# Patient Record
Sex: Female | Born: 1980 | Race: Black or African American | Hispanic: No | Marital: Single | Smoking: Light tobacco smoker
Health system: Southern US, Community
[De-identification: ages and names within clinical notes are randomized; demographics above are authoritative.]

## PROBLEM LIST (undated history)

## (undated) DIAGNOSIS — F419 Anxiety disorder, unspecified: Secondary | ICD-10-CM

## (undated) DIAGNOSIS — J189 Pneumonia, unspecified organism: Secondary | ICD-10-CM

## (undated) DIAGNOSIS — M545 Low back pain, unspecified: Secondary | ICD-10-CM

## (undated) DIAGNOSIS — F329 Major depressive disorder, single episode, unspecified: Secondary | ICD-10-CM

## (undated) DIAGNOSIS — L309 Dermatitis, unspecified: Secondary | ICD-10-CM

## (undated) DIAGNOSIS — G8929 Other chronic pain: Secondary | ICD-10-CM

## (undated) DIAGNOSIS — K219 Gastro-esophageal reflux disease without esophagitis: Secondary | ICD-10-CM

## (undated) DIAGNOSIS — R519 Headache, unspecified: Secondary | ICD-10-CM

## (undated) DIAGNOSIS — R51 Headache: Secondary | ICD-10-CM

## (undated) DIAGNOSIS — J45909 Unspecified asthma, uncomplicated: Secondary | ICD-10-CM

## (undated) DIAGNOSIS — R7303 Prediabetes: Secondary | ICD-10-CM

## (undated) DIAGNOSIS — Z9109 Other allergy status, other than to drugs and biological substances: Secondary | ICD-10-CM

## (undated) DIAGNOSIS — F32A Depression, unspecified: Secondary | ICD-10-CM

## (undated) HISTORY — PX: TOOTH EXTRACTION: SUR596

---

## 1999-03-06 ENCOUNTER — Emergency Department (HOSPITAL_COMMUNITY): Admission: EM | Admit: 1999-03-06 | Discharge: 1999-03-06 | Payer: Self-pay | Admitting: Emergency Medicine

## 1999-05-17 ENCOUNTER — Emergency Department (HOSPITAL_COMMUNITY): Admission: EM | Admit: 1999-05-17 | Discharge: 1999-05-17 | Payer: Self-pay | Admitting: Emergency Medicine

## 1999-06-02 ENCOUNTER — Encounter: Admission: RE | Admit: 1999-06-02 | Discharge: 1999-06-02 | Payer: Self-pay | Admitting: Family Medicine

## 2000-10-15 ENCOUNTER — Inpatient Hospital Stay (HOSPITAL_COMMUNITY): Admission: AD | Admit: 2000-10-15 | Discharge: 2000-10-15 | Payer: Self-pay | Admitting: *Deleted

## 2000-10-17 ENCOUNTER — Inpatient Hospital Stay (HOSPITAL_COMMUNITY): Admission: AD | Admit: 2000-10-17 | Discharge: 2000-10-17 | Payer: Self-pay | Admitting: Obstetrics

## 2009-01-29 ENCOUNTER — Emergency Department (HOSPITAL_COMMUNITY): Admission: EM | Admit: 2009-01-29 | Discharge: 2009-01-30 | Payer: Self-pay | Admitting: Emergency Medicine

## 2009-05-24 ENCOUNTER — Emergency Department (HOSPITAL_COMMUNITY): Admission: EM | Admit: 2009-05-24 | Discharge: 2009-05-24 | Payer: Self-pay | Admitting: Family Medicine

## 2011-03-20 LAB — COMPREHENSIVE METABOLIC PANEL
ALT: 27 U/L (ref 0–35)
CO2: 24 mEq/L (ref 19–32)
Calcium: 9.3 mg/dL (ref 8.4–10.5)
Creatinine, Ser: 0.81 mg/dL (ref 0.4–1.2)
GFR calc Af Amer: >60 mL/min (ref >60)
GFR calc non Af Amer: >60 mL/min (ref >60)
Glucose, Bld: 97 mg/dL (ref 70–99)
Sodium: 139 mEq/L (ref 135–145)
Total Protein: 6.9 g/dL (ref 6.0–8.3)

## 2011-03-20 LAB — CBC
Hemoglobin: 13.4 g/dL (ref 12.0–15.0)
MCHC: 33.3 g/dL (ref 30.0–36.0)
MCV: 89.1 fL (ref 78.0–100.0)
RBC: 4.53 MIL/uL (ref 3.87–5.11)
RDW: 15.1 % (ref 11.5–15.5)

## 2011-03-20 LAB — URINALYSIS, ROUTINE W REFLEX MICROSCOPIC
Glucose, UA: NEGATIVE mg/dL
Nitrite: NEGATIVE
Specific Gravity, Urine: 1.024 (ref 1.005–1.030)
pH: 6 (ref 5.0–8.0)

## 2011-03-20 LAB — LIPASE, BLOOD: Lipase: 24 U/L (ref 11–59)

## 2011-03-20 LAB — DIFFERENTIAL
Lymphocytes Relative: 27 % (ref 12–46)
Lymphs Abs: 2 10*3/uL (ref 0.7–4.0)
Monocytes Relative: 10 % (ref 3–12)
Neutrophils Relative %: 55 % (ref 43–77)

## 2011-03-20 LAB — POCT PREGNANCY, URINE: Preg Test, Ur: NEGATIVE

## 2012-12-27 ENCOUNTER — Emergency Department (HOSPITAL_COMMUNITY)
Admission: EM | Admit: 2012-12-27 | Discharge: 2012-12-27 | Disposition: A | Discharge status: Home / Self Care | Payer: Self-pay | Attending: Emergency Medicine | Admitting: Emergency Medicine

## 2012-12-27 ENCOUNTER — Encounter (HOSPITAL_COMMUNITY): Payer: Self-pay | Admitting: *Deleted

## 2012-12-27 DIAGNOSIS — K047 Periapical abscess without sinus: Secondary | ICD-10-CM | POA: Insufficient documentation

## 2012-12-27 MED ORDER — HYDROCODONE-ACETAMINOPHEN 5-325 MG PO TABS
1.0000 | ORAL_TABLET | Freq: Once | ORAL | Status: AC
  Administered 2012-12-27: 1 via ORAL
  Filled 2012-12-27: qty 1

## 2012-12-27 MED ORDER — IBUPROFEN 800 MG PO TABS: 800.0000 mg | ORAL_TABLET | Freq: Three times a day (TID) | ORAL | Status: DC | PRN

## 2012-12-27 MED ORDER — HYDROCODONE-ACETAMINOPHEN 5-325 MG PO TABS: 1.0000 | ORAL_TABLET | Freq: Four times a day (QID) | ORAL | Status: DC | PRN

## 2012-12-27 MED ORDER — IBUPROFEN 800 MG PO TABS
800.0000 mg | ORAL_TABLET | Freq: Once | ORAL | Status: AC
  Administered 2012-12-27: 800 mg via ORAL
  Filled 2012-12-27: qty 1

## 2012-12-27 MED ORDER — PENICILLIN V POTASSIUM 500 MG PO TABS: 500.0000 mg | ORAL_TABLET | Freq: Four times a day (QID) | ORAL | Status: DC

## 2012-12-27 NOTE — ED Notes (Signed)
Pt sts dental pain on the left side since Wednesday; pt reports "knot above tooth that is getting bigger". Pt sts took aleve for pain early this am.

## 2012-12-27 NOTE — ED Notes (Addendum)
Pt in c/o pain and swelling to left side of face, started as toothache on Wednesday, swelling has increased with increased pain into ear today. Denies fever.

## 2012-12-27 NOTE — ED Provider Notes (Signed)
History     CSN: [id number]  Arrival date & time 12/27/12  1633   First MD Initiated Contact with Patient 12/27/12 1708      Chief Complaint  Patient presents with  . Dental Pain    (Consider location/radiation/quality/duration/timing/severity/associated sxs/prior treatment) HPI Patient presents to the emergency department with left-sided first molar dental pain on the upper teeth.  Patient states this started Wednesday has continued since that time.  Patient states that she has some mild swelling around the gumline.  Patient denies nausea, vomiting, diarrhea, difficulty swallowing, difficulty breathing, or fever.  Patient is a rest with warm salt water, without relief.  She has tried no other treatment for her discomfort or condition History reviewed. No pertinent past medical history.  History reviewed. No pertinent past surgical history.  History reviewed. No pertinent family history.  History  Substance Use Topics  . Smoking status: Not on file  . Smokeless tobacco: Not on file  . Alcohol Use: Not on file    OB History    Grav Para Term Preterm Abortions TAB SAB Ect Mult Living                  Review of Systems All other systems negative except as documented in the HPI. All pertinent positives and negatives as reviewed in the HPI.  Allergies  Shellfish allergy  Home Medications   Current Outpatient Rx  Name  Route  Sig  Dispense  Refill  . ADULT MULTIVITAMIN W/MINERALS CH   Oral   Take 1 tablet by mouth daily.           BP 127/88  Pulse 94  Temp 98.1 F (36.7 C) (Oral)  Resp 20  SpO2 100%  Physical Exam  Nursing note and vitals reviewed. Constitutional: She appears well-developed and well-nourished.  HENT:  Head: Normocephalic and atraumatic.  Mouth/Throat: Uvula is midline.    Eyes: Pupils are equal, round, and reactive to light.  Cardiovascular: Normal rate, regular rhythm and normal heart sounds.   Pulmonary/Chest: Effort normal and  breath sounds normal.    ED Course  Procedures (including critical care time)   The patient will be treated for dental abscess. Told to follow up with dentistry.  MDM          Carlyle Dolly, PA-C 12/27/12 1818

## 2012-12-28 NOTE — ED Provider Notes (Signed)
Medical screening examination/treatment/procedure(s) were performed by non-physician practitioner and as supervising physician I was immediately available for consultation/collaboration.   Flint Melter, MD 12/28/12 [phone]

## 2014-12-17 ENCOUNTER — Telehealth: Payer: Self-pay

## 2014-12-17 NOTE — Telephone Encounter (Signed)
error 

## 2015-04-10 ENCOUNTER — Emergency Department (HOSPITAL_COMMUNITY)
Admission: EM | Admit: 2015-04-10 | Discharge: 2015-04-10 | Disposition: A | Discharge status: Home / Self Care | Payer: Medicaid Other | Attending: Emergency Medicine | Admitting: Emergency Medicine

## 2015-04-10 ENCOUNTER — Encounter (HOSPITAL_COMMUNITY): Payer: Self-pay | Admitting: Emergency Medicine

## 2015-04-10 ENCOUNTER — Emergency Department (HOSPITAL_COMMUNITY): Payer: Medicaid Other

## 2015-04-10 DIAGNOSIS — R1013 Epigastric pain: Secondary | ICD-10-CM | POA: Diagnosis present

## 2015-04-10 DIAGNOSIS — Z792 Long term (current) use of antibiotics: Secondary | ICD-10-CM | POA: Insufficient documentation

## 2015-04-10 DIAGNOSIS — K8018 Calculus of gallbladder with other cholecystitis without obstruction: Secondary | ICD-10-CM | POA: Diagnosis not present

## 2015-04-10 DIAGNOSIS — R109 Unspecified abdominal pain: Secondary | ICD-10-CM

## 2015-04-10 DIAGNOSIS — K8010 Calculus of gallbladder with chronic cholecystitis without obstruction: Secondary | ICD-10-CM

## 2015-04-10 LAB — COMPREHENSIVE METABOLIC PANEL
ALK PHOS: 58 U/L (ref 38–126)
ALT: 45 U/L (ref 14–54)
ANION GAP: 6 (ref 5–15)
AST: 69 U/L — ABNORMAL HIGH (ref 15–41)
Albumin: 3.5 g/dL (ref 3.5–5.0)
BUN: 8 mg/dL (ref 6–20)
CALCIUM: 9 mg/dL (ref 8.9–10.3)
CO2: 22 mmol/L (ref 22–32)
Chloride: 108 mmol/L (ref 101–111)
Creatinine, Ser: 0.87 mg/dL (ref 0.44–1.00)
GFR calc non Af Amer: >60 mL/min (ref >60)
Glucose, Bld: 132 mg/dL — ABNORMAL HIGH (ref 70–99)
Potassium: 4.5 mmol/L (ref 3.5–5.1)
SODIUM: 136 mmol/L (ref 135–145)
TOTAL PROTEIN: 7.1 g/dL (ref 6.5–8.1)
Total Bilirubin: 0.9 mg/dL (ref 0.3–1.2)

## 2015-04-10 LAB — CBC WITH DIFFERENTIAL/PLATELET
BASOS PCT: 0 % (ref 0–1)
Basophils Absolute: 0 10*3/uL (ref 0.0–0.1)
EOS ABS: 0.3 10*3/uL (ref 0.0–0.7)
EOS PCT: 3 % (ref 0–5)
HCT: 37.9 % (ref 36.0–46.0)
Hemoglobin: 12.6 g/dL (ref 12.0–15.0)
LYMPHS ABS: 2.5 10*3/uL (ref 0.7–4.0)
Lymphocytes Relative: 26 % (ref 12–46)
MCH: 28.6 pg (ref 26.0–34.0)
MCHC: 33.2 g/dL (ref 30.0–36.0)
MCV: 85.9 fL (ref 78.0–100.0)
MONOS PCT: 6 % (ref 3–12)
Monocytes Absolute: 0.6 10*3/uL (ref 0.1–1.0)
NEUTROS PCT: 65 % (ref 43–77)
Neutro Abs: 6.2 10*3/uL (ref 1.7–7.7)
PLATELETS: 246 10*3/uL (ref 150–400)
RBC: 4.41 MIL/uL (ref 3.87–5.11)
RDW: 13.1 % (ref 11.5–15.5)
WBC: 9.6 10*3/uL (ref 4.0–10.5)

## 2015-04-10 LAB — URINALYSIS, ROUTINE W REFLEX MICROSCOPIC
Bilirubin Urine: NEGATIVE
Glucose, UA: NEGATIVE mg/dL
KETONES UR: NEGATIVE mg/dL
LEUKOCYTES UA: NEGATIVE
NITRITE: NEGATIVE
PH: 6.5 (ref 5.0–8.0)
Protein, ur: NEGATIVE mg/dL
SPECIFIC GRAVITY, URINE: 1.005 (ref 1.005–1.030)
Urobilinogen, UA: 0.2 mg/dL (ref 0.0–1.0)

## 2015-04-10 LAB — URINE MICROSCOPIC-ADD ON

## 2015-04-10 LAB — LIPASE, BLOOD: Lipase: 38 U/L (ref 22–51)

## 2015-04-10 MED ORDER — PANTOPRAZOLE SODIUM 40 MG PO TBEC
40.0000 mg | DELAYED_RELEASE_TABLET | Freq: Once | ORAL | Status: AC
  Administered 2015-04-10: 40 mg via ORAL
  Filled 2015-04-10: qty 1

## 2015-04-10 MED ORDER — SUCRALFATE 1 G PO TABS
1.0000 g | ORAL_TABLET | Freq: Once | ORAL | Status: AC
  Administered 2015-04-10: 1 g via ORAL
  Filled 2015-04-10: qty 1

## 2015-04-10 MED ORDER — GI COCKTAIL ~~LOC~~
30.0000 mL | Freq: Once | ORAL | Status: AC
  Administered 2015-04-10: 30 mL via ORAL
  Filled 2015-04-10: qty 30

## 2015-04-10 MED ORDER — HYDROCODONE-ACETAMINOPHEN 5-325 MG PO TABS: 1.0000 | ORAL_TABLET | ORAL | Status: DC | PRN

## 2015-04-10 NOTE — ED Notes (Signed)
Ultrasound tech at at bedside.

## 2015-04-10 NOTE — ED Provider Notes (Addendum)
CSN: [id number]     Arrival date & time 04/10/15  0957 History   First MD Initiated Contact with Patient 04/10/15 1006     Chief Complaint  Patient presents with  . Abdominal Pain     (Consider location/radiation/quality/duration/timing/severity/associated sxs/prior Treatment) HPI Comments: Pt ate a greasy meal last pm  Patient is a 34 y.o. female presenting with abdominal pain. The history is provided by the patient.  Abdominal Pain Pain location:  Epigastric Pain quality: burning   Pain radiates to:  Does not radiate Pain severity:  Mild Onset quality:  Sudden Duration:  12 hours Timing:  Constant Progression:  Worsening Chronicity:  New Context: not alcohol use   Relieved by:  Nothing Worsened by:  Nothing tried Ineffective treatments:  OTC medications Associated symptoms: no diarrhea, no dysuria, no fever, no nausea and no vomiting     History reviewed. No pertinent past medical history. History reviewed. No pertinent past surgical history. History reviewed. No pertinent family history. History  Substance Use Topics  . Smoking status: Never Smoker   . Smokeless tobacco: Not on file  . Alcohol Use: No   OB History    No data available     Review of Systems  Constitutional: Negative for fever.  Gastrointestinal: Positive for abdominal pain. Negative for nausea, vomiting and diarrhea.  Genitourinary: Negative for dysuria.      Allergies  Shellfish allergy  Home Medications   Prior to Admission medications   Medication Sig Start Date End Date Taking? Authorizing Provider  HYDROcodone-acetaminophen (NORCO/VICODIN) 5-325 MG per tablet Take 1 tablet by mouth every 6 (six) hours as needed for pain. 12/27/12   Charlestine Nighthristopher Lawyer, PA-C  ibuprofen (ADVIL,MOTRIN) 800 MG tablet Take 1 tablet (800 mg total) by mouth every 8 (eight) hours as needed for pain. 12/27/12   Charlestine Nighthristopher Lawyer, PA-C  Multiple Vitamin (MULTIVITAMIN WITH MINERALS) TABS Take 1 tablet by mouth  daily.    Historical Provider, MD  penicillin v potassium (VEETID) 500 MG tablet Take 1 tablet (500 mg total) by mouth 4 (four) times daily. 12/27/12   Christopher Lawyer, PA-C   BP 105/47 mmHg  Pulse 80  Temp(Src) 98.7 F (37.1 C) (Oral)  Resp 18  SpO2 97%  LMP 03/27/2015 (Exact Date) Physical Exam  Constitutional: She is oriented to person, place, and time. She appears well-developed and well-nourished.  Non-toxic appearance. No distress.  HENT:  Head: Normocephalic and atraumatic.  Eyes: Conjunctivae, EOM and lids are normal. Pupils are equal, round, and reactive to light.  Neck: Normal range of motion. Neck supple. No tracheal deviation present. No thyroid mass present.  Cardiovascular: Normal rate, regular rhythm and normal heart sounds.  Exam reveals no gallop.   No murmur heard. Pulmonary/Chest: Effort normal and breath sounds normal. No stridor. No respiratory distress. She has no decreased breath sounds. She has no wheezes. She has no rhonchi. She has no rales.  Abdominal: Soft. Normal appearance and bowel sounds are normal. She exhibits no distension. There is tenderness in the right upper quadrant and epigastric area. There is no rigidity, no rebound, no guarding and no CVA tenderness.  Musculoskeletal: Normal range of motion. She exhibits no edema or tenderness.  Neurological: She is alert and oriented to person, place, and time. She has normal strength. No cranial nerve deficit or sensory deficit. GCS eye subscore is 4. GCS verbal subscore is 5. GCS motor subscore is 6.  Skin: Skin is warm and dry. No abrasion and no rash noted.  Psychiatric: She has a normal mood and affect. Her speech is normal and behavior is normal.  Nursing note and vitals reviewed.   ED Course  Procedures (including critical care time) Labs Review Labs Reviewed  CBC WITH DIFFERENTIAL/PLATELET  COMPREHENSIVE METABOLIC PANEL  LIPASE, BLOOD  URINALYSIS, ROUTINE W REFLEX MICROSCOPIC    Imaging  Review No results found.   EKG Interpretation None      MDM   Final diagnoses:  Abdominal pain    Patient with evidence of cholelithiasis on ultrasound per radiology. Given pain medications here feels better. We'll begin referral to general surgery    Lorre NickAnthony Julie Paolini, MD 04/10/15 1314  Lorre NickAnthony Broedy Osbourne, MD 05/01/15 2253

## 2015-04-10 NOTE — ED Notes (Signed)
Pt c/o mid abd pain that started around 0600 this morning.  Denies NVD.

## 2015-04-10 NOTE — Discharge Instructions (Signed)
Cholelithiasis °Cholelithiasis (also called gallstones) is a form of gallbladder disease in which gallstones form in your gallbladder. The gallbladder is an organ that stores bile made in the liver, which helps digest fats. Gallstones begin as small crystals and slowly grow into stones. Gallstone pain occurs when the gallbladder spasms and a gallstone is blocking the duct. Pain can also occur when a stone passes out of the duct.  °RISK FACTORS °· Being female.   °· Having multiple pregnancies. Health care providers sometimes advise removing diseased gallbladders before future pregnancies.   °· Being obese. °· Eating a diet heavy in fried foods and fat.   °· Being older than 60 years and increasing age.   °· Prolonged use of medicines containing female hormones.   °· Having diabetes mellitus.   °· Rapidly losing weight.   °· Having a family history of gallstones (heredity).   °SYMPTOMS °· Nausea.   °· Vomiting. °· Abdominal pain.   °· Yellowing of the skin (jaundice).   °· Sudden pain. It may persist from several minutes to several hours. °· Fever.   °· Tenderness to the touch.  °In some cases, when gallstones do not move into the bile duct, people have no pain or symptoms. These are called "silent" gallstones.  °TREATMENT °Silent gallstones do not need treatment. In severe cases, emergency surgery may be required. Options for treatment include: °· Surgery to remove the gallbladder. This is the most common treatment. °· Medicines. These do not always work and may take 6-12 months or more to work. °· Shock wave treatment (extracorporeal biliary lithotripsy). In this treatment an ultrasound machine sends shock waves to the gallbladder to break gallstones into smaller pieces that can pass into the intestines or be dissolved by medicine. °HOME CARE INSTRUCTIONS  °· Only take over-the-counter or prescription medicines for pain, discomfort, or fever as directed by your health care provider.   °· Follow a low-fat diet until  seen again by your health care provider. Fat causes the gallbladder to contract, which can result in pain.   °· Follow up with your health care provider as directed. Attacks are almost always recurrent and surgery is usually required for permanent treatment.   °SEEK IMMEDIATE MEDICAL CARE IF:  °· Your pain increases and is not controlled by medicines.   °· You have a fever or persistent symptoms for more than 2-3 days.   °· You have a fever and your symptoms suddenly get worse.   °· You have persistent nausea and vomiting.   °MAKE SURE YOU:  °· Understand these instructions. °· Will watch your condition. °· Will get help right away if you are not doing well or get worse. °Document Released: 11/15/2005 Document Revised: 07/22/2013 Document Reviewed: 05/13/2013 °ExitCare® Patient Information ©2015 [employer]. This information is not intended to replace advice given to you by your health care provider. Make sure you discuss any questions you have with your health care provider. ° °

## 2015-05-24 ENCOUNTER — Other Ambulatory Visit: Payer: Self-pay | Admitting: General Surgery

## 2015-05-26 ENCOUNTER — Encounter (HOSPITAL_COMMUNITY): Payer: Self-pay

## 2015-05-26 ENCOUNTER — Encounter (HOSPITAL_COMMUNITY)
Admission: RE | Admit: 2015-05-26 | Discharge: 2015-05-26 | Disposition: A | Discharge status: Home / Self Care | Payer: Medicaid Other | Source: Ambulatory Visit | Attending: General Surgery | Admitting: General Surgery

## 2015-05-26 DIAGNOSIS — K8020 Calculus of gallbladder without cholecystitis without obstruction: Secondary | ICD-10-CM | POA: Insufficient documentation

## 2015-05-26 DIAGNOSIS — Z01812 Encounter for preprocedural laboratory examination: Secondary | ICD-10-CM | POA: Diagnosis present

## 2015-05-26 HISTORY — DX: Anxiety disorder, unspecified: F41.9

## 2015-05-26 HISTORY — DX: Major depressive disorder, single episode, unspecified: F32.9

## 2015-05-26 HISTORY — DX: Depression, unspecified: F32.A

## 2015-05-26 LAB — CBC WITH DIFFERENTIAL/PLATELET
Basophils Absolute: 0 10*3/uL (ref 0.0–0.1)
Basophils Relative: 0 % (ref 0–1)
EOS ABS: 0.5 10*3/uL (ref 0.0–0.7)
Eosinophils Relative: 5 % (ref 0–5)
HEMATOCRIT: 38.2 % (ref 36.0–46.0)
HEMOGLOBIN: 12.9 g/dL (ref 12.0–15.0)
Lymphocytes Relative: 31 % (ref 12–46)
Lymphs Abs: 3.2 10*3/uL (ref 0.7–4.0)
MCH: 28.2 pg (ref 26.0–34.0)
MCHC: 33.8 g/dL (ref 30.0–36.0)
MCV: 83.6 fL (ref 78.0–100.0)
MONO ABS: 0.8 10*3/uL (ref 0.1–1.0)
MONOS PCT: 8 % (ref 3–12)
Neutro Abs: 5.7 10*3/uL (ref 1.7–7.7)
Neutrophils Relative %: 56 % (ref 43–77)
Platelets: 259 10*3/uL (ref 150–400)
RBC: 4.57 MIL/uL (ref 3.87–5.11)
RDW: 13.3 % (ref 11.5–15.5)
WBC: 10.2 10*3/uL (ref 4.0–10.5)

## 2015-05-26 LAB — COMPREHENSIVE METABOLIC PANEL
ALBUMIN: 3.4 g/dL — AB (ref 3.5–5.0)
ALK PHOS: 53 U/L (ref 38–126)
ALT: 34 U/L (ref 14–54)
ANION GAP: 6 (ref 5–15)
AST: 30 U/L (ref 15–41)
BUN: 11 mg/dL (ref 6–20)
CO2: 26 mmol/L (ref 22–32)
Calcium: 9.2 mg/dL (ref 8.9–10.3)
Chloride: 107 mmol/L (ref 101–111)
Creatinine, Ser: 0.93 mg/dL (ref 0.44–1.00)
GFR calc Af Amer: >60 mL/min (ref >60)
GFR calc non Af Amer: >60 mL/min (ref >60)
Glucose, Bld: 107 mg/dL — ABNORMAL HIGH (ref 65–99)
POTASSIUM: 4.3 mmol/L (ref 3.5–5.1)
Sodium: 139 mmol/L (ref 135–145)
Total Bilirubin: 0.6 mg/dL (ref 0.3–1.2)
Total Protein: 7.1 g/dL (ref 6.5–8.1)

## 2015-05-26 LAB — HCG, SERUM, QUALITATIVE: Preg, Serum: NEGATIVE

## 2015-05-26 NOTE — Progress Notes (Signed)
PCP is Dr Allyne Gee  Denies seeing a cardiologist Denies seeing a stress test, echo, card cath.

## 2015-05-26 NOTE — Pre-Procedure Instructions (Signed)
Jamoni Kennan  05/26/2015      [id number] [address] [place] [address] Phone: [phone] Fax: [phone]    Your procedure is scheduled on June 27  Report to [facility] Admitting at 1045 A.M.  Call this number if you have problems the morning of surgery:  [phone]   Remember:  Do not eat food or drink liquids after midnight.  Take these medicines the morning of surgery with A SIP OF WATER escitalopram (Lexapro), Hydrocodone-acetaminophen (Norco) if needed, Ranitidine (Zantac), Lorazepam (Ativan) if needed  Stop taking aspirin, Aleve, Ibuprofen, BC's, Goody's. Herbal medications, Fish Oil   Do not wear jewelry, make-up or nail polish.  Do not wear lotions, powders, or perfumes.  You may wear deodorant.  Do not shave 48 hours prior to surgery.  Men may shave face and neck.  Do not bring valuables to the hospital.  [facility] is not responsible for any belongings or valuables.  Contacts, dentures or bridgework may not be worn into surgery.  Leave your suitcase in the car.  After surgery it may be brought to your room.  For patients admitted to the hospital, discharge time will be determined by your treatment team.  Patients discharged the day of surgery will not be allowed to drive home.    Special instructions:  [facility] - Preparing for Surgery  Before surgery, you can play an important role.  Because skin is not sterile, your skin needs to be as free of germs as possible.  You can reduce the number of germs on you skin by washing with CHG (chlorahexidine gluconate) soap before surgery.  CHG is an antiseptic cleaner which kills germs and bonds with the skin to continue killing germs even after washing.  Please DO NOT use if you have an allergy to CHG or antibacterial soaps.  If your skin becomes reddened/irritated stop using the CHG and inform your nurse when you arrive at Short Stay.  Do not  shave (including legs and underarms) for at least 48 hours prior to the first CHG shower.  You may shave your face.  Please follow these instructions carefully:   1.  Shower with CHG Soap the night before surgery and the morning of Surgery.  2.  If you choose to wash your hair, wash your hair first as usual with your  normal shampoo.  3.  After you shampoo, rinse your hair and body thoroughly to remove the Shampoo.  4.  Use CHG as you would any other liquid soap.  You can apply chg directly  to the skin and wash gently with scrungie or a clean washcloth.  5.  Apply the CHG Soap to your body ONLY FROM THE NECK DOWN.   Do not use on open wounds or open sores.  Avoid contact with your eyes,  ears, mouth and genitals (private parts).  Wash genitals (private parts)  with your normal soap.  6.  Wash thoroughly, paying special attention to the area where your surgery will be performed.  7.  Thoroughly rinse your body with warm water from the neck down.  8.  DO NOT shower/wash with your normal soap after using and rinsing off the CHG Soap.  9.  Pat yourself dry with a clean towel.            10.  Wear clean pajamas.            11.  Place  clean sheets on your bed the night of your first shower and do not sleep with pets.  Day of Surgery  Do not apply any lotions/deoderants the morning of surgery.  Please wear clean clothes to the hospital/surgery center.     Please read over the following fact sheets that you were given. Pain Booklet, Coughing and Deep Breathing and Surgical Site Infection Prevention

## 2015-05-29 MED ORDER — DEXTROSE 5 % IV SOLN
2.0000 g | INTRAVENOUS | Status: AC
  Administered 2015-05-30: 2 g via INTRAVENOUS
  Filled 2015-05-29: qty 2

## 2015-05-29 MED ORDER — CEFOXITIN SODIUM 2 G IV SOLR
2.0000 g | INTRAVENOUS | Status: DC
  Filled 2015-05-29: qty 2

## 2015-05-30 ENCOUNTER — Encounter (HOSPITAL_COMMUNITY)
Admission: RE | Disposition: A | Discharge status: Home / Self Care | Payer: Self-pay | Source: Ambulatory Visit | Attending: General Surgery

## 2015-05-30 ENCOUNTER — Encounter (HOSPITAL_COMMUNITY): Payer: Self-pay | Admitting: General Practice

## 2015-05-30 ENCOUNTER — Ambulatory Visit (HOSPITAL_COMMUNITY): Payer: Medicaid Other | Admitting: Anesthesiology

## 2015-05-30 ENCOUNTER — Ambulatory Visit (HOSPITAL_COMMUNITY)
Admission: RE | Admit: 2015-05-30 | Discharge: 2015-05-30 | Disposition: A | Discharge status: Home / Self Care | Payer: Medicaid Other | Source: Ambulatory Visit | Attending: General Surgery | Admitting: General Surgery

## 2015-05-30 DIAGNOSIS — K8010 Calculus of gallbladder with chronic cholecystitis without obstruction: Secondary | ICD-10-CM | POA: Insufficient documentation

## 2015-05-30 DIAGNOSIS — K219 Gastro-esophageal reflux disease without esophagitis: Secondary | ICD-10-CM | POA: Diagnosis not present

## 2015-05-30 DIAGNOSIS — Z87891 Personal history of nicotine dependence: Secondary | ICD-10-CM | POA: Insufficient documentation

## 2015-05-30 DIAGNOSIS — F419 Anxiety disorder, unspecified: Secondary | ICD-10-CM | POA: Insufficient documentation

## 2015-05-30 DIAGNOSIS — K8020 Calculus of gallbladder without cholecystitis without obstruction: Secondary | ICD-10-CM | POA: Diagnosis present

## 2015-05-30 DIAGNOSIS — Z79899 Other long term (current) drug therapy: Secondary | ICD-10-CM | POA: Diagnosis not present

## 2015-05-30 HISTORY — PX: CHOLECYSTECTOMY: SHX55

## 2015-05-30 SURGERY — LAPAROSCOPIC CHOLECYSTECTOMY
Anesthesia: General | Site: Abdomen

## 2015-05-30 MED ORDER — ONDANSETRON HCL 4 MG/2ML IJ SOLN
INTRAMUSCULAR | Status: AC
  Filled 2015-05-30: qty 2

## 2015-05-30 MED ORDER — OXYCODONE HCL 5 MG PO TABS
ORAL_TABLET | ORAL | Status: AC
  Filled 2015-05-30: qty 2

## 2015-05-30 MED ORDER — KETOROLAC TROMETHAMINE 15 MG/ML IJ SOLN
INTRAMUSCULAR | Status: AC
  Filled 2015-05-30: qty 1

## 2015-05-30 MED ORDER — SODIUM CHLORIDE 0.9 % IJ SOLN: 3.0000 mL | INTRAMUSCULAR | Status: DC | PRN

## 2015-05-30 MED ORDER — ARTIFICIAL TEARS OP OINT
TOPICAL_OINTMENT | OPHTHALMIC | Status: DC | PRN
Start: 2015-05-30 — End: 2015-05-30
  Administered 2015-05-30: 1 via OPHTHALMIC

## 2015-05-30 MED ORDER — ONDANSETRON HCL 4 MG/2ML IJ SOLN
INTRAMUSCULAR | Status: DC | PRN
  Administered 2015-05-30: 4 mg via INTRAVENOUS

## 2015-05-30 MED ORDER — HYDROMORPHONE HCL 1 MG/ML IJ SOLN
INTRAMUSCULAR | Status: AC
  Filled 2015-05-30: qty 1

## 2015-05-30 MED ORDER — PROPOFOL 10 MG/ML IV BOLUS
INTRAVENOUS | Status: AC
  Filled 2015-05-30: qty 20

## 2015-05-30 MED ORDER — LIDOCAINE HCL (CARDIAC) 20 MG/ML IV SOLN
INTRAVENOUS | Status: DC | PRN
  Administered 2015-05-30: 100 mg via INTRAVENOUS

## 2015-05-30 MED ORDER — FENTANYL CITRATE (PF) 250 MCG/5ML IJ SOLN
INTRAMUSCULAR | Status: AC
  Filled 2015-05-30: qty 5

## 2015-05-30 MED ORDER — 0.9 % SODIUM CHLORIDE (POUR BTL) OPTIME
TOPICAL | Status: DC | PRN
  Administered 2015-05-30: 1000 mL

## 2015-05-30 MED ORDER — MORPHINE SULFATE 2 MG/ML IJ SOLN: 2.0000 mg | INTRAMUSCULAR | Status: DC | PRN

## 2015-05-30 MED ORDER — OXYCODONE-ACETAMINOPHEN 10-325 MG PO TABS: 1.0000 | ORAL_TABLET | Freq: Four times a day (QID) | ORAL | Status: AC | PRN

## 2015-05-30 MED ORDER — KETOROLAC TROMETHAMINE 15 MG/ML IJ SOLN
15.0000 mg | Freq: Four times a day (QID) | INTRAMUSCULAR | Status: AC | PRN
  Administered 2015-05-30: 15 mg via INTRAVENOUS

## 2015-05-30 MED ORDER — LIDOCAINE HCL (CARDIAC) 20 MG/ML IV SOLN
INTRAVENOUS | Status: AC
  Filled 2015-05-30: qty 5

## 2015-05-30 MED ORDER — HYDROMORPHONE HCL 1 MG/ML IJ SOLN
0.2500 mg | INTRAMUSCULAR | Status: DC | PRN
Start: 2015-05-30 — End: 2015-05-30
  Administered 2015-05-30 (×4): 0.5 mg via INTRAVENOUS

## 2015-05-30 MED ORDER — SODIUM CHLORIDE 0.9 % IV SOLN: 250.0000 mL | INTRAVENOUS | Status: DC | PRN

## 2015-05-30 MED ORDER — ACETAMINOPHEN 650 MG RE SUPP: 650.0000 mg | RECTAL | Status: DC | PRN

## 2015-05-30 MED ORDER — MEPERIDINE HCL 25 MG/ML IJ SOLN: 6.2500 mg | INTRAMUSCULAR | Status: DC | PRN

## 2015-05-30 MED ORDER — MIDAZOLAM HCL 2 MG/2ML IJ SOLN
INTRAMUSCULAR | Status: AC
  Filled 2015-05-30: qty 2

## 2015-05-30 MED ORDER — NEOSTIGMINE METHYLSULFATE 10 MG/10ML IV SOLN
INTRAVENOUS | Status: DC | PRN
  Administered 2015-05-30: 5 mg via INTRAVENOUS

## 2015-05-30 MED ORDER — SODIUM CHLORIDE 0.9 % IV SOLN: INTRAVENOUS | Status: DC

## 2015-05-30 MED ORDER — BUPIVACAINE-EPINEPHRINE (PF) 0.25% -1:200000 IJ SOLN
INTRAMUSCULAR | Status: AC
  Filled 2015-05-30: qty 30

## 2015-05-30 MED ORDER — SODIUM CHLORIDE 0.9 % IR SOLN
Status: DC | PRN
  Administered 2015-05-30: 1

## 2015-05-30 MED ORDER — PROPOFOL 10 MG/ML IV BOLUS
INTRAVENOUS | Status: DC | PRN
  Administered 2015-05-30: 40 mg via INTRAVENOUS
  Administered 2015-05-30: 160 mg via INTRAVENOUS

## 2015-05-30 MED ORDER — ACETAMINOPHEN 325 MG PO TABS: 650.0000 mg | ORAL_TABLET | ORAL | Status: DC | PRN

## 2015-05-30 MED ORDER — PROMETHAZINE HCL 25 MG/ML IJ SOLN: 6.2500 mg | INTRAMUSCULAR | Status: DC | PRN

## 2015-05-30 MED ORDER — GLYCOPYRROLATE 0.2 MG/ML IJ SOLN
INTRAMUSCULAR | Status: DC | PRN
  Administered 2015-05-30: 0.6 mg via INTRAVENOUS

## 2015-05-30 MED ORDER — LACTATED RINGERS IV SOLN
INTRAVENOUS | Status: DC
  Administered 2015-05-30 (×3): via INTRAVENOUS

## 2015-05-30 MED ORDER — FENTANYL CITRATE (PF) 100 MCG/2ML IJ SOLN
INTRAMUSCULAR | Status: DC | PRN
  Administered 2015-05-30: 50 ug via INTRAVENOUS
  Administered 2015-05-30: 100 ug via INTRAVENOUS
  Administered 2015-05-30: 50 ug via INTRAVENOUS

## 2015-05-30 MED ORDER — SODIUM CHLORIDE 0.9 % IJ SOLN: 3.0000 mL | Freq: Two times a day (BID) | INTRAMUSCULAR | Status: DC

## 2015-05-30 MED ORDER — BUPIVACAINE-EPINEPHRINE 0.25% -1:200000 IJ SOLN
INTRAMUSCULAR | Status: DC | PRN
  Administered 2015-05-30: 30 mL

## 2015-05-30 MED ORDER — OXYCODONE HCL 5 MG PO TABS
5.0000 mg | ORAL_TABLET | ORAL | Status: DC | PRN
  Administered 2015-05-30: 10 mg via ORAL

## 2015-05-30 MED ORDER — DEXAMETHASONE SODIUM PHOSPHATE 10 MG/ML IJ SOLN
INTRAMUSCULAR | Status: DC | PRN
  Administered 2015-05-30: 10 mg via INTRAVENOUS

## 2015-05-30 MED ORDER — SUCCINYLCHOLINE CHLORIDE 20 MG/ML IJ SOLN
INTRAMUSCULAR | Status: AC
  Filled 2015-05-30: qty 1

## 2015-05-30 MED ORDER — DEXAMETHASONE SODIUM PHOSPHATE 10 MG/ML IJ SOLN
INTRAMUSCULAR | Status: AC
  Filled 2015-05-30: qty 1

## 2015-05-30 MED ORDER — VECURONIUM BROMIDE 10 MG IV SOLR
INTRAVENOUS | Status: DC | PRN
  Administered 2015-05-30: 4 mg via INTRAVENOUS

## 2015-05-30 MED ORDER — MIDAZOLAM HCL 5 MG/5ML IJ SOLN
INTRAMUSCULAR | Status: DC | PRN
  Administered 2015-05-30: 2 mg via INTRAVENOUS

## 2015-05-30 SURGICAL SUPPLY — 43 items
APPLIER CLIP 5 13 M/L LIGAMAX5 (MISCELLANEOUS) ×3
APR CLP MED LRG 5 ANG JAW (MISCELLANEOUS) ×1
BAG SPEC RTRVL 10 TROC 200 (ENDOMECHANICALS) ×1
BLADE SURG ROTATE 9660 (MISCELLANEOUS) IMPLANT
CANISTER SUCTION 2500CC (MISCELLANEOUS) ×3 IMPLANT
CHLORAPREP W/TINT 26ML (MISCELLANEOUS) ×3 IMPLANT
CLIP APPLIE 5 13 M/L LIGAMAX5 (MISCELLANEOUS) ×1 IMPLANT
CLOSURE WOUND 1/2 X4 (GAUZE/BANDAGES/DRESSINGS)
COVER SURGICAL LIGHT HANDLE (MISCELLANEOUS) ×3 IMPLANT
DEVICE TROCAR PUNCTURE CLOSURE (ENDOMECHANICALS) ×2 IMPLANT
DRSG TEGADERM 2-3/8X2-3/4 SM (GAUZE/BANDAGES/DRESSINGS) ×2 IMPLANT
ELECT REM PT RETURN 9FT ADLT (ELECTROSURGICAL) ×3
ELECTRODE REM PT RTRN 9FT ADLT (ELECTROSURGICAL) ×1 IMPLANT
GLOVE BIO SURGEON STRL SZ7 (GLOVE) ×3 IMPLANT
GLOVE BIO SURGEON STRL SZ7.5 (GLOVE) ×4 IMPLANT
GLOVE BIOGEL PI IND STRL 7.0 (GLOVE) IMPLANT
GLOVE BIOGEL PI IND STRL 7.5 (GLOVE) ×1 IMPLANT
GLOVE BIOGEL PI INDICATOR 7.0 (GLOVE) ×2
GLOVE BIOGEL PI INDICATOR 7.5 (GLOVE) ×4
GLOVE SURG SS PI 7.0 STRL IVOR (GLOVE) ×2 IMPLANT
GOWN STRL REUS W/ TWL LRG LVL3 (GOWN DISPOSABLE) ×3 IMPLANT
GOWN STRL REUS W/TWL LRG LVL3 (GOWN DISPOSABLE) ×9
KIT BASIN OR (CUSTOM PROCEDURE TRAY) ×3 IMPLANT
KIT ROOM TURNOVER OR (KITS) ×3 IMPLANT
LIQUID BAND (GAUZE/BANDAGES/DRESSINGS) ×3 IMPLANT
NS IRRIG 1000ML POUR BTL (IV SOLUTION) ×3 IMPLANT
PAD ARMBOARD 7.5X6 YLW CONV (MISCELLANEOUS) ×3 IMPLANT
PENCIL BUTTON HOLSTER BLD 10FT (ELECTRODE) ×2 IMPLANT
POUCH RETRIEVAL ECOSAC 10 (ENDOMECHANICALS) ×1 IMPLANT
POUCH RETRIEVAL ECOSAC 10MM (ENDOMECHANICALS) ×2
SCISSORS LAP 5X35 DISP (ENDOMECHANICALS) ×3 IMPLANT
SET IRRIG TUBING LAPAROSCOPIC (IRRIGATION / IRRIGATOR) ×3 IMPLANT
SLEEVE ENDOPATH XCEL 5M (ENDOMECHANICALS) ×6 IMPLANT
SPECIMEN JAR SMALL (MISCELLANEOUS) ×3 IMPLANT
STRIP CLOSURE SKIN 1/2X4 (GAUZE/BANDAGES/DRESSINGS) IMPLANT
SUT MNCRL AB 4-0 PS2 18 (SUTURE) ×3 IMPLANT
SUT VICRYL 0 UR6 27IN ABS (SUTURE) ×2 IMPLANT
TOWEL OR 17X24 6PK STRL BLUE (TOWEL DISPOSABLE) ×3 IMPLANT
TOWEL OR 17X26 10 PK STRL BLUE (TOWEL DISPOSABLE) ×3 IMPLANT
TRAY LAPAROSCOPIC (CUSTOM PROCEDURE TRAY) ×3 IMPLANT
TROCAR XCEL BLUNT TIP 100MML (ENDOMECHANICALS) ×3 IMPLANT
TROCAR XCEL NON-BLD 5MMX100MML (ENDOMECHANICALS) ×3 IMPLANT
TUBING INSUFFLATION (TUBING) ×3 IMPLANT

## 2015-05-30 NOTE — Transfer of Care (Signed)
Immediate Anesthesia Transfer of Care Note  Patient: Sara MccreedyRhonda Narayanan  Procedure(s) Performed: Procedure(s): LAPAROSCOPIC CHOLECYSTECTOMY (N/A)  Patient Location: PACU  Anesthesia Type:General  Level of Consciousness: awake, oriented, sedated, patient cooperative and responds to stimulation  Airway & Oxygen Therapy: Patient Spontanous Breathing and Patient connected to nasal cannula oxygen  Post-op Assessment: Report given to RN, Post -op Vital signs reviewed and stable, Patient moving all extremities and Patient moving all extremities X 4  Post vital signs: Reviewed and stable  Last Vitals:  Filed Vitals:   05/30/15 1242  BP: 141/66  Pulse: 82  Temp: 36.5 C  Resp: 20    Complications: No apparent anesthesia complications

## 2015-05-30 NOTE — Anesthesia Postprocedure Evaluation (Signed)
Anesthesia Post Note  Patient: Sara MccreedyRhonda Kimberley  Procedure(s) Performed: Procedure(s) (LRB): LAPAROSCOPIC CHOLECYSTECTOMY (N/A)  Anesthesia type: General  Patient location: PACU  Post pain: Pain level controlled  Post assessment: Post-op Vital signs reviewed  Last Vitals: BP 134/71 mmHg  Pulse 73  Temp(Src) 36.2 C (Oral)  Resp 15  Ht 5' 11.75" (1.822 m)  Wt 264 lb 8 oz (119.976 kg)  BMI 36.14 kg/m2  SpO2 93%  LMP  (LMP Unknown)  Post vital signs: Reviewed  Level of consciousness: sedated  Complications: No apparent anesthesia complications

## 2015-05-30 NOTE — Discharge Instructions (Signed)
[facility] LAPAROSCOPIC SURGERY: POST OP INSTRUCTIONS  Always review your discharge instruction sheet given to you by the facility where your surgery was performed. IF YOU HAVE DISABILITY OR FAMILY LEAVE FORMS, YOU MUST BRING THEM TO THE OFFICE FOR PROCESSING.   DO NOT GIVE THEM TO YOUR DOCTOR.  1. A prescription for pain medication may be given to you upon discharge.  Take your pain medication as prescribed, if needed.  If narcotic pain medicine is not needed, then you may take acetaminophen (Tylenol), naprosyn (Alleve), or ibuprofen (Advil) as needed. 2. Take your usually prescribed medications unless otherwise directed. 3. If you need a refill on your pain medication, please contact your pharmacy.  They will contact our office to request authorization. Prescriptions will not be filled after 5pm or on week-ends. 4. You should follow a light diet the first few days after arrival home, such as soup and crackers, etc.  Be sure to include lots of fluids daily. 5. Most patients will experience some swelling and bruising in the area of the incisions.  Ice packs will help.  Swelling and bruising can take several days to resolve.  6. It is common to experience some constipation if taking pain medication after surgery.  Increasing fluid intake and taking a stool softener (such as Colace) will usually help or prevent this problem from occurring.  A mild laxative (Milk of Magnesia or Miralax) should be taken according to package instructions if there are no bowel movements after 48 hours. 7. Unless discharge instructions indicate otherwise, you may remove your bandages 48 hours after surgery, and you may shower at that time.  You may have steri-strips (small skin tapes) in place directly over the incision.  These strips should be left on the skin for 7-10 days.  If your surgeon used skin glue on the incision, you may shower in 24 hours.  The glue will flake  off over the next 2-3 weeks.  Any sutures or staples will be removed at the office during your follow-up visit. 8. ACTIVITIES:  You may resume regular (light) daily activities beginning the next day--such as daily self-care, walking, climbing stairs--gradually increasing activities as tolerated.  You may have sexual intercourse when it is comfortable.  Refrain from any heavy lifting or straining until approved by your doctor. a. You may drive when you are no longer taking prescription pain medication, you can comfortably wear a seatbelt, and you can safely maneuver your car and apply brakes. b. RETURN TO WORK:  __________________________________________________________ 9. You should see your doctor in the office for a follow-up appointment approximately 2-3 weeks after your surgery.  Make sure that you call for this appointment within a day or two after you arrive home to insure a convenient appointment time. 10. OTHER INSTRUCTIONS: __________________________________________________________________________________________________________________________ __________________________________________________________________________________________________________________________ WHEN TO CALL YOUR DOCTOR: 1. Fever over 101.0 2. Inability to urinate 3. Continued bleeding from incision. 4. Increased pain, redness, or drainage from the incision. 5. Increasing abdominal pain  The clinic staff is available to answer your questions during regular business hours.  Please don't hesitate to call and ask to speak to one of the nurses for clinical concerns.  If you have a medical emergency, go to the nearest emergency room or call 911.  A surgeon from [facility] is always on call at the hospital. [address], [address], [place], [place]  [address] ? [address] [address], [place], [place]   [address] [phone] ? [phone] ? [phone] Web site: [email or web address]  

## 2015-05-30 NOTE — Op Note (Signed)
Preoperative diagnosis: symptomatic cholelithiasis Postoperative diagnosis: same as above Procedure: laparoscopic cholecystectomy Surgeon: Dr Harden MoMatt Itza Maniaci Anesthesia: general EBL: minimal Drains none Specimen gb and contents to pathology Complications: none Sponge count correct at completion Disposition to recovery stable  Indications: This is a [age] yof who presents with ruq pain, cholelithiasis and history of the same. We discussed proceeding with her laparoscopic cholecystectomy.  Procedure: After informed consent was obtained the patient was taken to the operating room. She was given antibiotics. Sequential compression devices were on her legs. She was placed under general anesthesia without complication. Her abdomen was prepped and draped in the standard sterile surgical fashion. A surgical timeout was then performed.  I infiltrated marcaine below the umbilicus. I made an incision and then entered the fascia sharply. I then entered the peritoneum bluntly. I placed a 0 vicryl pursestring suture and inserted a hasson trocar. I then inserted 3 further 5 mm trocars in the epigastrium and ruq. I then was able to retract the gallbladder cephalad and lateral.  I was able to identify the cystic duct and clearly had the critical view of safety. I then clipped the cystic duct and divided it. The duct was viable and the clips traversed the duct. I then treated the artery in similar fashion as it was immediately adjacent to the duct. I did spill some bile when removing from the liver where it was adherent. I then removed the gallbladder from the liver bed and placed it in a bag. It was then removed from the umbilical incision. I then obtained hemostasis and irrigated. I then removed the umbilical trocar and closed with 0 vicryl and the endoclose device after tying down the pursestring.  I then desufflated the abdomen and removed all my remaining trocars. I then close these with 4-0 Monocryl and  Dermabond. She tolerated this well was extubated and transferred to the recovery room in stable condition

## 2015-05-30 NOTE — H&P (Signed)
34 yof with "indigestion" for some time which she describes as some epigastric pain. Got worse recently with episode requiring trip to er. she was noted on us to have cholelithaisis, nl lfts. she is 3 months postpartum. she has had several other smaller episodes that wake her at night since then. some relation to eating. no real n/v no change in bms.  Other Problems Elease Hashimoto(Alisha Spillers, CMA; 05/12/2015 10:13 AM) Anxiety Disorder Back Pain Chest pain Cholelithiasis Depression Gastroesophageal Reflux Disease  Past Surgical History Ethlyn Gallery(Alisha Spillers, CMA; 05/12/2015 10:13 AM) Oral Surgery  Diagnostic Studies History Elease Hashimoto(Alisha Spillers, CMA; 05/12/2015 10:13 AM) Colonoscopy never Mammogram never  Allergies Elease Hashimoto(Alisha Spillers, CMA; 05/12/2015 10:14 AM) No Known Drug Allergies06/08/2015  Medication History (Alisha Spillers, CMA; 05/12/2015 10:16 AM) Chlorhexidine Gluconate (0.12% Solution, Mouth/Throat) Active. LORazepam (0.5MG  Tablet, Oral) Active. Medications Reconciled  Social History Ethlyn Gallery(Alisha Spillers, CMA; 05/12/2015 10:13 AM) Alcohol use Occasional alcohol use. Caffeine use Carbonated beverages, Coffee, Tea. Illicit drug use Remotely quit drug use. Tobacco use Former smoker.  Family History Ethlyn Gallery(Alisha Spillers, CMA; 05/12/2015 10:13 AM) Cancer Father. Diabetes Mellitus Father, Mother, Sister. Hypertension Father. Migraine Headache Sister.  Pregnancy / Birth History Ethlyn Gallery(Alisha Spillers, CMA; 05/12/2015 10:13 AM) Age at menarche 12 years. Contraceptive History Contraceptive implant. Gravida 1 Maternal age 35-35 Para 1 Regular periods  Review of Systems Elease Hashimoto(Alisha Spillers CMA; 05/12/2015 10:13 AM) General Present- Fatigue, Night Sweats and Weight Gain. Not Present- Appetite Loss, Chills, Fever and Weight Loss. Skin Present- Dryness. Not Present- Change in Wart/Mole, Hives, Jaundice, New Lesions, Non-Healing Wounds, Rash and Ulcer. HEENT Present- Seasonal Allergies, Sinus Pain,  Sore Throat and Wears glasses/contact lenses. Not Present- Earache, Hearing Loss, Hoarseness, Nose Bleed, Oral Ulcers, Ringing in the Ears, Visual Disturbances and Yellow Eyes. Breast Not Present- Breast Mass, Breast Pain, Nipple Discharge and Skin Changes. Cardiovascular Not Present- Chest Pain, Difficulty Breathing Lying Down, Leg Cramps, Palpitations, Rapid Heart Rate, Shortness of Breath and Swelling of Extremities. Gastrointestinal Present- Abdominal Pain, Bloating, Constipation and Indigestion. Not Present- Bloody Stool, Change in Bowel Habits, Chronic diarrhea, Difficulty Swallowing, Excessive gas, Gets full quickly at meals, Hemorrhoids, Nausea, Rectal Pain and Vomiting. Female Genitourinary Not Present- Frequency, Nocturia, Painful Urination, Pelvic Pain and Urgency. Musculoskeletal Present- Back Pain and Joint Stiffness. Not Present- Joint Pain, Muscle Pain, Muscle Weakness and Swelling of Extremities. Neurological Not Present- Decreased Memory, Fainting, Headaches, Numbness, Seizures, Tingling, Tremor, Trouble walking and Weakness. Psychiatric Present- Anxiety and Depression. Not Present- Bipolar, Change in Sleep Pattern, Fearful and Frequent crying. Endocrine Not Present- Cold Intolerance, Excessive Hunger, Hair Changes, Heat Intolerance, Hot flashes and New Diabetes. Hematology Present- Easy Bruising. Not Present- Excessive bleeding, Gland problems, HIV and Persistent Infections.   Vitals (Alisha Spillers CMA; 05/12/2015 10:14 AM) 05/12/2015 10:13 AM Weight: 262 lb Height: 71.5in Body Surface Area: 2.45 m Body Mass Index: 36.03 kg/m Temp.: 98.30F(Oral)  Pulse: 96 (Regular)  BP: 112/74 (Sitting, Left Arm, Standard)    Physical Exam Emelia Loron(Jaylynne Birkhead MD; 05/12/2015 10:18 AM) General Mental Status-Alert. Orientation-Oriented X3.  Eye Sclera/Conjunctiva - Bilateral-No scleral icterus.  Chest and Lung Exam Chest and lung exam reveals -on auscultation, normal  breath sounds, no adventitious sounds and normal vocal resonance.  Cardiovascular Cardiovascular examination reveals -normal heart sounds, regular rate and rhythm with no murmurs.  Abdomen Note: soft mild tender ruq nondistended     Assessment & Plan Emelia Loron(Jaicob Dia MD; 05/12/2015 10:17 AM) CHOLELITHIASIS (574.20  K80.20) Story: Lap chole I discussed the procedure in detail. The patient was given Agricultural engineereducational material.  We discussed the risks and benefits of a laparoscopic cholecystectomy and possible cholangiogram including, but not limited to bleeding, infection, injury to surrounding structures such as the intestine or liver, bile leak, retained gallstones, need to convert to an open procedure, prolonged diarrhea, blood clots such as DVT, common bile duct injury, anesthesia risks, and possible need for additional procedures. The likelihood of improvement in symptoms and return to the patient's normal status is good. We discussed the typical post-operative recovery course.

## 2015-05-30 NOTE — Progress Notes (Signed)
Pt ambulated to bathroom without diff and voided large amt.  No c/o pain.

## 2015-05-30 NOTE — Anesthesia Procedure Notes (Signed)
Procedure Name: Intubation Date/Time: 05/30/2015 11:45 AM Performed by: Wray Kearns A Pre-anesthesia Checklist: Patient identified, Timeout performed, Emergency Drugs available, Suction available and Patient being monitored Patient Re-evaluated:Patient Re-evaluated prior to inductionOxygen Delivery Method: Circle system utilized Preoxygenation: Pre-oxygenation with 100% oxygen Intubation Type: IV induction and Cricoid Pressure applied Ventilation: Mask ventilation without difficulty Laryngoscope Size: Mac and 4 Grade View: Grade I Tube type: Oral Tube size: 7.5 mm Number of attempts: 1 Airway Equipment and Method: Stylet Placement Confirmation: ETT inserted through vocal cords under direct vision,  breath sounds checked- equal and bilateral and positive ETCO2 Secured at: 23 cm Tube secured with: Tape Dental Injury: Teeth and Oropharynx as per pre-operative assessment

## 2015-05-30 NOTE — Anesthesia Preprocedure Evaluation (Signed)
Anesthesia Evaluation  Patient identified by MRN, date of birth, ID band Patient awake    Reviewed: Allergy & Precautions, NPO status , Patient's Chart, lab work & pertinent test results  Airway Mallampati: II  TM Distance: >3 FB Neck ROM: Full    Dental no notable dental hx.    Pulmonary neg pulmonary ROS,  breath sounds clear to auscultation  Pulmonary exam normal       Cardiovascular negative cardio ROS Normal cardiovascular examRhythm:Regular Rate:Normal     Neuro/Psych PSYCHIATRIC DISORDERS Anxiety Depression negative neurological ROS     GI/Hepatic negative GI ROS, Neg liver ROS,   Endo/Other  negative endocrine ROS  Renal/GU negative Renal ROS     Musculoskeletal negative musculoskeletal ROS (+)   Abdominal   Peds  Hematology negative hematology ROS (+)   Anesthesia Other Findings   Reproductive/Obstetrics negative OB ROS                             Anesthesia Physical Anesthesia Plan  ASA: II  Anesthesia Plan: General   Post-op Pain Management:    Induction: Intravenous  Airway Management Planned: Oral ETT  Additional Equipment: None  Intra-op Plan:   Post-operative Plan: Extubation in OR  Informed Consent: I have reviewed the patients History and Physical, chart, labs and discussed the procedure including the risks, benefits and alternatives for the proposed anesthesia with the patient or authorized representative who has indicated his/her understanding and acceptance.   Dental advisory given  Plan Discussed with: CRNA  Anesthesia Plan Comments:         Anesthesia Quick Evaluation

## 2015-05-31 ENCOUNTER — Encounter (HOSPITAL_COMMUNITY): Payer: Self-pay | Admitting: General Surgery

## 2015-09-13 ENCOUNTER — Encounter (HOSPITAL_COMMUNITY): Payer: Self-pay | Admitting: Emergency Medicine

## 2015-09-13 ENCOUNTER — Emergency Department (HOSPITAL_COMMUNITY)
Admission: EM | Admit: 2015-09-13 | Discharge: 2015-09-13 | Disposition: A | Discharge status: Home / Self Care | Payer: Medicaid Other | Attending: Emergency Medicine | Admitting: Emergency Medicine

## 2015-09-13 DIAGNOSIS — L309 Dermatitis, unspecified: Secondary | ICD-10-CM

## 2015-09-13 DIAGNOSIS — F419 Anxiety disorder, unspecified: Secondary | ICD-10-CM | POA: Diagnosis not present

## 2015-09-13 DIAGNOSIS — Z79899 Other long term (current) drug therapy: Secondary | ICD-10-CM | POA: Diagnosis not present

## 2015-09-13 DIAGNOSIS — Z7951 Long term (current) use of inhaled steroids: Secondary | ICD-10-CM | POA: Insufficient documentation

## 2015-09-13 DIAGNOSIS — R21 Rash and other nonspecific skin eruption: Secondary | ICD-10-CM | POA: Diagnosis present

## 2015-09-13 DIAGNOSIS — F329 Major depressive disorder, single episode, unspecified: Secondary | ICD-10-CM | POA: Insufficient documentation

## 2015-09-13 MED ORDER — MUPIROCIN CALCIUM 2 % EX CREA: 1.0000 "application " | TOPICAL_CREAM | Freq: Two times a day (BID) | CUTANEOUS | Status: AC

## 2015-09-13 MED ORDER — BETAMETHASONE DIPROPIONATE 0.05 % EX OINT: TOPICAL_OINTMENT | Freq: Two times a day (BID) | CUTANEOUS | Status: DC

## 2015-09-13 NOTE — ED Provider Notes (Signed)
CSN: [id number]     Arrival date & time 09/13/15  1019 History   First MD Initiated Contact with Patient 09/13/15 1040     Chief Complaint  Patient presents with  . Rash     (Consider location/radiation/quality/duration/timing/severity/associated sxs/prior Treatment) HPI Comments: Patient is a 34 year old female with history of eczema who presents to the ED with complaint of worsening eczema. She notes she has had a flareup on her hands for the past few days. Endorses crusty rash in between fingers. She has been using triamcinolone ointment that was prescribed to her by her OB/GYN 7 months ago and notes she has not had any relief. Denies fever chills swelling and redness. Patient reports she has never had this bad of a flare before.   Past Medical History  Diagnosis Date  . Depression   . Anxiety    Past Surgical History  Procedure Laterality Date  . Mouth surgery    . Cholecystectomy N/A 05/30/2015    Procedure: LAPAROSCOPIC CHOLECYSTECTOMY;  Surgeon: Emelia Loron, MD;  Location: [facility] OR;  Service: General;  Laterality: N/A;   No family history on file. Social History  Substance Use Topics  . Smoking status: Never Smoker   . Smokeless tobacco: None  . Alcohol Use: No   OB History    No data available     Review of Systems  Constitutional: Negative for fever and chills.  Musculoskeletal: Negative for myalgias, joint swelling and arthralgias.  Skin: Positive for rash.  Neurological: Negative for weakness and numbness.      Allergies  Shellfish allergy  Home Medications   Prior to Admission medications   Medication Sig Start Date End Date Taking? Authorizing Provider  Beclomethasone Dipropionate (QNASL) 80 MCG/ACT AERS Place 2 sprays into the nose daily.    Historical Provider, MD  escitalopram (LEXAPRO) 10 MG tablet Take 10 mg by mouth daily.    Historical Provider, MD  HYDROcodone-acetaminophen (NORCO/VICODIN) 5-325 MG per tablet Take 1-2 tablets by mouth every  4 (four) hours as needed. 04/10/15   Lorre Nick, MD  ibuprofen (ADVIL,MOTRIN) 800 MG tablet Take 1 tablet (800 mg total) by mouth every 8 (eight) hours as needed for pain. Patient not taking: Reported on 04/10/2015 12/27/12   Charlestine Night, PA-C  LORazepam (ATIVAN) 1 MG tablet Take 0.5-1 mg by mouth 2 (two) times daily as needed for anxiety.    Historical Provider, MD  MIRENA 20 MCG/24HR IUD 1 each by Intrauterine route once.  03/22/15   Historical Provider, MD  Multiple Vitamin (MULTIVITAMIN WITH MINERALS) TABS Take 1 tablet by mouth daily.    Historical Provider, MD  oxyCODONE-acetaminophen (PERCOCET) 10-325 MG per tablet Take 1 tablet by mouth every 6 (six) hours as needed for pain. 05/30/15 05/29/16  Emelia Loron, MD  penicillin v potassium (VEETID) 500 MG tablet Take 1 tablet (500 mg total) by mouth 4 (four) times daily. Patient not taking: Reported on 04/10/2015 12/27/12   Charlestine Night, PA-C  ranitidine (ZANTAC) 150 MG tablet Take 150 mg by mouth 2 (two) times daily.    Historical Provider, MD   BP 138/77 mmHg  Pulse 89  Temp(Src) 98.6 F (37 C) (Oral)  Resp 18  SpO2 100% Physical Exam  Constitutional: She is oriented to person, place, and time. She appears well-developed and well-nourished.  HENT:  Head: Normocephalic and atraumatic.  Mouth/Throat: Oropharynx is clear and moist. No oropharyngeal exudate.  Eyes: Conjunctivae and EOM are normal. Right eye exhibits no discharge. Left eye exhibits no  discharge. No scleral icterus.  Neck: Normal range of motion. Neck supple.  Pulmonary/Chest: Effort normal.  Musculoskeletal: Normal range of motion. She exhibits no edema or tenderness.  Neurological: She is alert and oriented to person, place, and time.  Skin: Skin is warm and dry. Rash (honey colored crusting with few pustules noted inbetween her left 3rd and 4th fingers and right 3rd and 4th digits with serous drainage.  No redness, no swelling, no vesicles. ) noted.  Nursing  note and vitals reviewed.   ED Course  Procedures (including critical care time) Labs Review Labs Reviewed - No data to display  Imaging Review No results found. I have personally reviewed and evaluated these images and lab results as part of my medical decision-making.  Filed Vitals:   09/13/15 1036  BP: 138/77  Pulse: 89  Temp: 98.6 F (37 C)  Resp: 18    Meds given in ED:  Medications - No data to display  Discharge Medication List as of 09/13/2015 12:04 PM    START taking these medications   Details  betamethasone dipropionate (DIPROLENE) 0.05 % ointment Apply topically 2 (two) times daily., Starting 09/13/2015, Until Discontinued, Print    mupirocin cream (BACTROBAN) 2 % Apply 1 application topically 2 (two) times daily., Starting 09/13/2015, Until Thu 09/22/15, Print         MDM   Final diagnoses:  Eczema    Patient presents with eczema flareup. She reports no relief with triamcinolone cream at home. Exam reveals any colored crust 16 with serous drainage around her right and left third and fourth digits, few pustules noted. No redness, no swelling, no vesicles. Rash is consistent with moderate to severe eczema flare. Plan to discharge patient home with Diprolene ointment and Bactroban cream to tx likely infection. Patient given dermatology follow-up.  Evaluation does not show pathology requring ongoing emergent intervention or admission. Pt is hemodynamically stable and mentating appropriately. Discussed findings/results and plan with patient/guardian, who agrees with plan. All questions answered. Return precautions discussed and outpatient follow up given.      Satira Sark [place], [place] 09/13/15 1215  Elwin Mocha, MD 09/13/15 [id number]

## 2015-09-13 NOTE — ED Notes (Signed)
Pt states she has eczema. States she has had a flare up for the past few days.  Crusty rash noted to hands and in between fingers.

## 2015-09-13 NOTE — Discharge Instructions (Signed)
Take your prescriptions as prescribed. Use the Bactroban ointment for 10 days.  Follow up with your dermatologist in 1 week. Please return to the Emergency Department if symptoms worsen or new onset of fever, swelling, numbness, tingling, drainage.

## 2016-06-25 ENCOUNTER — Emergency Department (HOSPITAL_COMMUNITY)
Admission: EM | Admit: 2016-06-25 | Discharge: 2016-06-25 | Disposition: A | Discharge status: Home / Self Care | Payer: Medicaid Other | Attending: Emergency Medicine | Admitting: Emergency Medicine

## 2016-06-25 ENCOUNTER — Encounter (HOSPITAL_COMMUNITY): Payer: Self-pay | Admitting: Emergency Medicine

## 2016-06-25 DIAGNOSIS — Z79899 Other long term (current) drug therapy: Secondary | ICD-10-CM | POA: Diagnosis not present

## 2016-06-25 DIAGNOSIS — F17210 Nicotine dependence, cigarettes, uncomplicated: Secondary | ICD-10-CM | POA: Diagnosis not present

## 2016-06-25 DIAGNOSIS — T783XXA Angioneurotic edema, initial encounter: Secondary | ICD-10-CM | POA: Diagnosis present

## 2016-06-25 MED ORDER — PREDNISONE 20 MG PO TABS: 40.0000 mg | ORAL_TABLET | Freq: Every day | ORAL | 0 refills | Status: DC

## 2016-06-25 MED ORDER — SODIUM CHLORIDE 0.9 % IV BOLUS (SEPSIS)
500.0000 mL | Freq: Once | INTRAVENOUS | Status: AC
  Administered 2016-06-25: 500 mL via INTRAVENOUS

## 2016-06-25 MED ORDER — FAMOTIDINE IN NACL 20-0.9 MG/50ML-% IV SOLN
20.0000 mg | Freq: Once | INTRAVENOUS | Status: AC
  Administered 2016-06-25: 20 mg via INTRAVENOUS
  Filled 2016-06-25: qty 50

## 2016-06-25 MED ORDER — METHYLPREDNISOLONE SODIUM SUCC 125 MG IJ SOLR
125.0000 mg | Freq: Once | INTRAMUSCULAR | Status: AC
  Administered 2016-06-25: 125 mg via INTRAVENOUS
  Filled 2016-06-25: qty 2

## 2016-06-25 NOTE — ED Provider Notes (Signed)
[facility] DEPT Provider Note   CSN: [id number] Arrival date & time: 06/25/16  0708  First Provider Contact: 7:32 AM      History   Chief Complaint Chief Complaint  Patient presents with  . Facial Swelling    HPI Sara Scott is a 35 y.o. female who presents with angioedema. She has a pmh of multiple allergies and eczema. The patietn states that lst night around 11:00PM she had some discomfort and swelling in the left upper lip. She woke up this morning with sig swelling of the left side of her mouth. She has had similar reactions to shellfish. She denies eating any food allergens last night. She takes no oral medications on a regular  Basis. She took a 29m benadryl pta. She denies, voice change, throat swelling or itching, wheezing, SOB or hives.   HPI  Past Medical History:  Diagnosis Date  . Anxiety   . Depression     There are no active problems to display for this patient.   Past Surgical History:  Procedure Laterality Date  . CHOLECYSTECTOMY N/A 05/30/2015   Procedure: LAPAROSCOPIC CHOLECYSTECTOMY;  Surgeon: MRolm Bookbinder MD;  [facility];  Service: General;  Laterality: N/A;  . MOUTH SURGERY      OB History    No data available       Home Medications    Prior to Admission medications   Medication Sig Start Date End Date Taking? Authorizing Provider  betamethasone dipropionate (DIPROLENE) 0.05 % ointment Apply topically 2 (two) times daily. Patient taking differently: Apply 1 application topically 2 (two) times daily as needed (rash).  09/13/15  Yes NChesley NoonNadeau, PA-C  escitalopram (LEXAPRO) 10 MG tablet Take 10 mg by mouth daily as needed (depression).    Yes Historical Provider, MD  fluticasone (FLONASE) 50 MCG/ACT nasal spray Place 2 sprays into both nostrils 2 (two) times daily.   Yes Historical Provider, MD  loratadine (CLARITIN) 10 MG tablet Take 10 mg by mouth 2 (two) times daily.   Yes Historical Provider, MD  MIRENA 20  MCG/24HR IUD 1 each by Intrauterine route once.  03/22/15  Yes Historical Provider, MD  Multiple Vitamin (MULTIVITAMIN WITH MINERALS) TABS Take 1 tablet by mouth daily.   Yes Historical Provider, MD  ranitidine (ZANTAC) 150 MG tablet Take 150 mg by mouth 2 (two) times daily as needed for heartburn.    Yes Historical Provider, MD    Family History No family history on file.  Social History Social History  Substance Use Topics  . Smoking status: Light Tobacco Smoker    Types: Cigarettes  . Smokeless tobacco: Never Used  . Alcohol use No     Allergies   Shellfish allergy; Nickel; and Silver   Review of Systems Review of Systems Ten systems reviewed and are negative for acute change, except as noted in the HPI.    Physical Exam Updated Vital Signs BP 128/77 (BP Location: Right Arm)   Pulse 66   Temp 98.4 F (36.9 C) (Oral)   Resp 18   Ht 6' (1.829 m)   Wt 108.9 kg   LMP 06/11/2016   SpO2 98%   BMI 32.55 kg/m   Physical Exam Physical Exam  Nursing note and vitals reviewed. Constitutional: She is oriented to person, place, and time. She appears well-developed and well-nourished. No distress.  HENT:  Head: Normocephalic and atraumatic. Swelling of the left side of the mouth and lips without tongue or throat involvement.  Eyes: Conjunctivae normal  and EOM are normal. Pupils are equal, round, and reactive to light. No scleral icterus.  Neck: Normal range of motion. No stridor Cardiovascular: Normal rate, regular rhythm and normal heart sounds.  Exam reveals no gallop and no friction rub.   No murmur heard. Pulmonary/Chest: Effort normal and breath sounds normal. No respiratory distress. No wheezing Abdominal: Soft. Bowel sounds are normal. She exhibits no distension and no mass. There is no tenderness. There is no guarding.  Neurological: She is alert and oriented to person, place, and time.  Skin: Skin is warm and dry. She is not diaphoretic. No hives    ED Treatments  / Results  Labs (all labs ordered are listed, but only abnormal results are displayed) Labs Reviewed - No data to display  EKG  EKG Interpretation None       Radiology No results found.  Procedures Procedures (including critical care time)  Medications Ordered in ED Medications  famotidine (PEPCID) IVPB 20 mg premix (0 mg Intravenous Stopped 06/25/16 0844)  methylPREDNISolone sodium succinate (SOLU-MEDROL) 125 mg/2 mL injection 125 mg (125 mg Intravenous Given 06/25/16 0756)  sodium chloride 0.9 % bolus 500 mL (0 mLs Intravenous Stopped 06/25/16 0956)     Initial Impression / Assessment and Plan / ED Course  I have reviewed the triage vital signs and the nursing notes.  Pertinent labs & imaging results that were available during my care of the patient were reviewed by me and considered in my medical decision making (see chart for details).  Clinical Course  Comment By Time  Patient given solumedrol/pepcid/ benadryl with mild worsening of sxs. She continues to be stable and airway is intact. Feel shewill need obs admit for her angioedema Margarita Mail, PA-C 07/24 8682  Patient has chosen to leave. I had a long discussion about risks of leaving including worsening swelling, and even death from airway compromise. The patient is competent to make the decision against admission. She is encouraged to return immediately for any new or worsening sxs. I have given her referral to allergy and immunology. Margarita Mail, PA-C 07/24 1100   11:02 AM BP 128/77 (BP Location: Right Arm)   Pulse 66   Temp 98.4 F (36.9 C) (Oral)   Resp 18   Ht 6' (1.829 m)   Wt 108.9 kg   LMP 06/11/2016   SpO2 98%   BMI 32.55 kg/m  Patient with angioedema of the lips without airway compromise. HDS. Will treat for allergic rxn with iv solumedrol 125 and pepcid 20. She has already taken her benadryl. Will hold off on epi unless there is sig worsening.   Final Clinical Impressions(s) / ED Diagnoses    Final diagnoses:  Angioedema, initial encounter    New Prescriptions New Prescriptions   No medications on file     Margarita Mail, PA-C 06/25/16 [place], MD 07/05/16 2211

## 2016-06-25 NOTE — ED Triage Notes (Signed)
Patient states that last night she noticed a bump under the skin on her left side of her face.  Then this morning when she woke up has swelling to left cheek and lips.  Patient denies and SOB or tongue swelling. Patient states left side of face is painful to touch. Patient also denies eating or trying any new products.

## 2017-07-18 ENCOUNTER — Emergency Department (HOSPITAL_COMMUNITY): Payer: Medicaid Other

## 2017-07-18 ENCOUNTER — Observation Stay (HOSPITAL_COMMUNITY)
Admission: EM | Admit: 2017-07-18 | Discharge: 2017-07-19 | Disposition: A | Discharge status: Home / Self Care | Payer: Medicaid Other | Attending: Internal Medicine | Admitting: Internal Medicine

## 2017-07-18 ENCOUNTER — Observation Stay (HOSPITAL_COMMUNITY): Payer: Medicaid Other

## 2017-07-18 ENCOUNTER — Encounter (HOSPITAL_COMMUNITY): Payer: Self-pay | Admitting: Emergency Medicine

## 2017-07-18 DIAGNOSIS — F17210 Nicotine dependence, cigarettes, uncomplicated: Secondary | ICD-10-CM | POA: Insufficient documentation

## 2017-07-18 DIAGNOSIS — J690 Pneumonitis due to inhalation of food and vomit: Secondary | ICD-10-CM | POA: Insufficient documentation

## 2017-07-18 DIAGNOSIS — F4310 Post-traumatic stress disorder, unspecified: Secondary | ICD-10-CM | POA: Insufficient documentation

## 2017-07-18 DIAGNOSIS — F329 Major depressive disorder, single episode, unspecified: Secondary | ICD-10-CM | POA: Diagnosis not present

## 2017-07-18 DIAGNOSIS — T50901A Poisoning by unspecified drugs, medicaments and biological substances, accidental (unintentional), initial encounter: Secondary | ICD-10-CM | POA: Diagnosis present

## 2017-07-18 DIAGNOSIS — Z79899 Other long term (current) drug therapy: Secondary | ICD-10-CM | POA: Diagnosis not present

## 2017-07-18 DIAGNOSIS — J9602 Acute respiratory failure with hypercapnia: Secondary | ICD-10-CM

## 2017-07-18 DIAGNOSIS — J9601 Acute respiratory failure with hypoxia: Principal | ICD-10-CM | POA: Insufficient documentation

## 2017-07-18 DIAGNOSIS — J324 Chronic pansinusitis: Secondary | ICD-10-CM | POA: Insufficient documentation

## 2017-07-18 DIAGNOSIS — F419 Anxiety disorder, unspecified: Secondary | ICD-10-CM

## 2017-07-18 DIAGNOSIS — N179 Acute kidney failure, unspecified: Secondary | ICD-10-CM | POA: Diagnosis not present

## 2017-07-18 DIAGNOSIS — F418 Other specified anxiety disorders: Secondary | ICD-10-CM | POA: Diagnosis not present

## 2017-07-18 DIAGNOSIS — E669 Obesity, unspecified: Secondary | ICD-10-CM | POA: Insufficient documentation

## 2017-07-18 DIAGNOSIS — Y9289 Other specified places as the place of occurrence of the external cause: Secondary | ICD-10-CM | POA: Insufficient documentation

## 2017-07-18 DIAGNOSIS — J189 Pneumonia, unspecified organism: Secondary | ICD-10-CM

## 2017-07-18 DIAGNOSIS — E872 Acidosis: Secondary | ICD-10-CM

## 2017-07-18 DIAGNOSIS — F1910 Other psychoactive substance abuse, uncomplicated: Secondary | ICD-10-CM | POA: Diagnosis not present

## 2017-07-18 DIAGNOSIS — G92 Toxic encephalopathy: Secondary | ICD-10-CM | POA: Insufficient documentation

## 2017-07-18 DIAGNOSIS — T65892A Toxic effect of other specified substances, intentional self-harm, initial encounter: Secondary | ICD-10-CM | POA: Diagnosis not present

## 2017-07-18 HISTORY — DX: Low back pain: M54.5

## 2017-07-18 HISTORY — DX: Other allergy status, other than to drugs and biological substances: Z91.09

## 2017-07-18 HISTORY — DX: Low back pain, unspecified: M54.50

## 2017-07-18 HISTORY — DX: Pneumonia, unspecified organism: J18.9

## 2017-07-18 HISTORY — DX: Other chronic pain: G89.29

## 2017-07-18 HISTORY — DX: Gastro-esophageal reflux disease without esophagitis: K21.9

## 2017-07-18 HISTORY — DX: Prediabetes: R73.03

## 2017-07-18 HISTORY — DX: Headache: R51

## 2017-07-18 HISTORY — DX: Dermatitis, unspecified: L30.9

## 2017-07-18 HISTORY — DX: Headache, unspecified: R51.9

## 2017-07-18 HISTORY — DX: Unspecified asthma, uncomplicated: J45.909

## 2017-07-18 LAB — CBC WITH DIFFERENTIAL/PLATELET
BASOS ABS: 0 10*3/uL (ref 0.0–0.1)
Basophils Relative: 0 %
Eosinophils Absolute: 0.5 10*3/uL (ref 0.0–0.7)
Eosinophils Relative: 2 %
HEMATOCRIT: 36.6 % (ref 36.0–46.0)
HEMOGLOBIN: 12.2 g/dL (ref 12.0–15.0)
LYMPHS PCT: 20 %
Lymphs Abs: 5.4 10*3/uL — ABNORMAL HIGH (ref 0.7–4.0)
MCH: 28.4 pg (ref 26.0–34.0)
MCHC: 33.3 g/dL (ref 30.0–36.0)
MCV: 85.3 fL (ref 78.0–100.0)
MONOS PCT: 5 %
Monocytes Absolute: 1.4 10*3/uL — ABNORMAL HIGH (ref 0.1–1.0)
NEUTROS ABS: 19.8 10*3/uL — AB (ref 1.7–7.7)
Neutrophils Relative %: 73 %
Platelets: 295 10*3/uL (ref 150–400)
RBC: 4.29 MIL/uL (ref 3.87–5.11)
RDW: 13.8 % (ref 11.5–15.5)
WBC: 27.1 10*3/uL — AB (ref 4.0–10.5)

## 2017-07-18 LAB — I-STAT ARTERIAL BLOOD GAS, ED
ACID-BASE DEFICIT: 3 mmol/L — AB (ref 0.0–2.0)
ACID-BASE DEFICIT: 2 mmol/L (ref 0.0–2.0)
Bicarbonate: 25.4 mmol/L (ref 20.0–28.0)
Bicarbonate: 25.4 mmol/L (ref 20.0–28.0)
O2 Saturation: 89 %
O2 Saturation: 92 %
PH ART: 7.243 — AB (ref 7.350–7.450)
PH ART: 7.278 — AB (ref 7.350–7.450)
TCO2: 27 mmol/L (ref 0–100)
TCO2: 27 mmol/L (ref 0–100)
pCO2 arterial: 58.2 mmHg — ABNORMAL HIGH (ref 32.0–48.0)
pCO2 arterial: 54 mmHg — ABNORMAL HIGH (ref 32.0–48.0)
pO2, Arterial: 64 mmHg — ABNORMAL LOW (ref 83.0–108.0)
pO2, Arterial: 73 mmHg — ABNORMAL LOW (ref 83.0–108.0)

## 2017-07-18 LAB — COMPREHENSIVE METABOLIC PANEL
ALBUMIN: 3.4 g/dL — AB (ref 3.5–5.0)
ALK PHOS: 57 U/L (ref 38–126)
ALT: 22 U/L (ref 14–54)
ANION GAP: 9 (ref 5–15)
AST: 32 U/L (ref 15–41)
BUN: 7 mg/dL (ref 6–20)
CALCIUM: 8.7 mg/dL — AB (ref 8.9–10.3)
CO2: 26 mmol/L (ref 22–32)
Chloride: 102 mmol/L (ref 101–111)
Creatinine, Ser: 1.18 mg/dL — ABNORMAL HIGH (ref 0.44–1.00)
GFR calc Af Amer: >60 mL/min (ref >60)
GFR calc non Af Amer: 58 mL/min — ABNORMAL LOW (ref >60)
GLUCOSE: 248 mg/dL — AB (ref 65–99)
Potassium: 4.2 mmol/L (ref 3.5–5.1)
SODIUM: 137 mmol/L (ref 135–145)
Total Bilirubin: 0.3 mg/dL (ref 0.3–1.2)
Total Protein: 6.9 g/dL (ref 6.5–8.1)

## 2017-07-18 LAB — URINALYSIS, ROUTINE W REFLEX MICROSCOPIC
BACTERIA UA: NONE SEEN
Bilirubin Urine: NEGATIVE
Glucose, UA: >=500 mg/dL — AB
KETONES UR: NEGATIVE mg/dL
Leukocytes, UA: NEGATIVE
Nitrite: NEGATIVE
PH: 5 (ref 5.0–8.0)
PROTEIN: NEGATIVE mg/dL
Specific Gravity, Urine: 1.016 (ref 1.005–1.030)

## 2017-07-18 LAB — RAPID URINE DRUG SCREEN, HOSP PERFORMED
AMPHETAMINES: NOT DETECTED
BENZODIAZEPINES: POSITIVE — AB
Barbiturates: NOT DETECTED
COCAINE: POSITIVE — AB
Opiates: NOT DETECTED
Tetrahydrocannabinol: POSITIVE — AB

## 2017-07-18 LAB — MAGNESIUM: Magnesium: 1.8 mg/dL (ref 1.7–2.4)

## 2017-07-18 LAB — ETHANOL

## 2017-07-18 LAB — SALICYLATE LEVEL: Salicylate Lvl: <7 mg/dL (ref 2.8–30.0)

## 2017-07-18 LAB — I-STAT CG4 LACTIC ACID, ED
LACTIC ACID, VENOUS: 2.92 mmol/L — AB (ref 0.5–1.9)
Lactic Acid, Venous: 2.55 mmol/L (ref 0.5–1.9)

## 2017-07-18 LAB — I-STAT BETA HCG BLOOD, ED (MC, WL, AP ONLY): I-stat hCG, quantitative: <5 m[IU]/mL (ref <5)

## 2017-07-18 LAB — TROPONIN I

## 2017-07-18 LAB — PROCALCITONIN: Procalcitonin: 0.75 ng/mL

## 2017-07-18 LAB — HEMOGLOBIN A1C
HEMOGLOBIN A1C: 5.9 % — AB (ref 4.8–5.6)
MEAN PLASMA GLUCOSE: 122.63 mg/dL

## 2017-07-18 LAB — ACETAMINOPHEN LEVEL: Acetaminophen (Tylenol), Serum: <10 ug/mL — ABNORMAL LOW (ref 10–30)

## 2017-07-18 MED ORDER — ACETAMINOPHEN 325 MG PO TABS: 650.0000 mg | ORAL_TABLET | Freq: Four times a day (QID) | ORAL | Status: DC | PRN

## 2017-07-18 MED ORDER — NALOXONE HCL 0.4 MG/ML IJ SOLN: 0.4000 mg | INTRAMUSCULAR | Status: DC | PRN

## 2017-07-18 MED ORDER — ACETAMINOPHEN 650 MG RE SUPP: 650.0000 mg | Freq: Four times a day (QID) | RECTAL | Status: DC | PRN

## 2017-07-18 MED ORDER — MONTELUKAST SODIUM 10 MG PO TABS
10.0000 mg | ORAL_TABLET | Freq: Every day | ORAL | Status: DC
  Administered 2017-07-18: 10 mg via ORAL
  Filled 2017-07-18: qty 1

## 2017-07-18 MED ORDER — BUSPIRONE HCL 10 MG PO TABS
15.0000 mg | ORAL_TABLET | Freq: Two times a day (BID) | ORAL | Status: DC
  Administered 2017-07-18 – 2017-07-19 (×3): 15 mg via ORAL
  Filled 2017-07-18 (×3): qty 2

## 2017-07-18 MED ORDER — IBUPROFEN 600 MG PO TABS
600.0000 mg | ORAL_TABLET | Freq: Four times a day (QID) | ORAL | Status: DC | PRN
  Administered 2017-07-18 – 2017-07-19 (×2): 600 mg via ORAL
  Filled 2017-07-18 (×2): qty 1

## 2017-07-18 MED ORDER — FAMOTIDINE 20 MG PO TABS
20.0000 mg | ORAL_TABLET | Freq: Two times a day (BID) | ORAL | Status: DC
  Administered 2017-07-18 – 2017-07-19 (×3): 20 mg via ORAL
  Filled 2017-07-18 (×3): qty 1

## 2017-07-18 MED ORDER — NALOXONE HCL 2 MG/2ML IJ SOSY
1.0000 mg | PREFILLED_SYRINGE | Freq: Once | INTRAMUSCULAR | Status: AC
  Administered 2017-07-18: 1 mg via INTRAVENOUS
  Filled 2017-07-18: qty 2

## 2017-07-18 MED ORDER — VITAMIN B-1 100 MG PO TABS
100.0000 mg | ORAL_TABLET | Freq: Every day | ORAL | Status: DC
  Administered 2017-07-18 – 2017-07-19 (×2): 100 mg via ORAL
  Filled 2017-07-18 (×2): qty 1

## 2017-07-18 MED ORDER — LORATADINE 10 MG PO TABS
10.0000 mg | ORAL_TABLET | Freq: Every day | ORAL | Status: DC
  Administered 2017-07-18 – 2017-07-19 (×2): 10 mg via ORAL
  Filled 2017-07-18 (×2): qty 1

## 2017-07-18 MED ORDER — PAROXETINE HCL 20 MG PO TABS
40.0000 mg | ORAL_TABLET | ORAL | Status: DC
  Administered 2017-07-18 – 2017-07-19 (×2): 40 mg via ORAL
  Filled 2017-07-18 (×2): qty 2

## 2017-07-18 MED ORDER — ONDANSETRON HCL 4 MG PO TABS: 4.0000 mg | ORAL_TABLET | Freq: Four times a day (QID) | ORAL | Status: DC | PRN

## 2017-07-18 MED ORDER — ONDANSETRON HCL 4 MG/2ML IJ SOLN: 4.0000 mg | Freq: Four times a day (QID) | INTRAMUSCULAR | Status: DC | PRN

## 2017-07-18 MED ORDER — NALOXONE HCL 2 MG/2ML IJ SOSY
0.7500 mg/h | PREFILLED_SYRINGE | INTRAVENOUS | Status: DC
  Administered 2017-07-18: 0.75 mg/h via INTRAVENOUS
  Filled 2017-07-18: qty 4

## 2017-07-18 MED ORDER — POTASSIUM CHLORIDE IN NACL 20-0.9 MEQ/L-% IV SOLN
INTRAVENOUS | Status: AC
  Administered 2017-07-18 (×2): via INTRAVENOUS
  Filled 2017-07-18 (×2): qty 1000

## 2017-07-18 MED ORDER — PNEUMOCOCCAL VAC POLYVALENT 25 MCG/0.5ML IJ INJ
0.5000 mL | INJECTION | INTRAMUSCULAR | Status: AC
  Administered 2017-07-19: 0.5 mL via INTRAMUSCULAR
  Filled 2017-07-18: qty 0.5

## 2017-07-18 MED ORDER — ENOXAPARIN SODIUM 40 MG/0.4ML ~~LOC~~ SOLN
40.0000 mg | SUBCUTANEOUS | Status: DC
  Administered 2017-07-18: 40 mg via SUBCUTANEOUS
  Filled 2017-07-18: qty 0.4

## 2017-07-18 MED ORDER — FOLIC ACID 1 MG PO TABS
1.0000 mg | ORAL_TABLET | Freq: Every day | ORAL | Status: DC
  Administered 2017-07-18 – 2017-07-19 (×2): 1 mg via ORAL
  Filled 2017-07-18 (×2): qty 1

## 2017-07-18 MED ORDER — SODIUM CHLORIDE 0.9% FLUSH
3.0000 mL | Freq: Two times a day (BID) | INTRAVENOUS | Status: DC
  Administered 2017-07-18 – 2017-07-19 (×3): 3 mL via INTRAVENOUS

## 2017-07-18 MED ORDER — ACETAMINOPHEN 500 MG PO TABS: 1000.0000 mg | ORAL_TABLET | Freq: Four times a day (QID) | ORAL | Status: DC | PRN

## 2017-07-18 MED ORDER — KETOROLAC TROMETHAMINE 15 MG/ML IJ SOLN: 15.0000 mg | Freq: Three times a day (TID) | INTRAMUSCULAR | Status: DC | PRN

## 2017-07-18 MED ORDER — SODIUM CHLORIDE 0.9 % IV SOLN
3.0000 g | Freq: Three times a day (TID) | INTRAVENOUS | Status: DC
  Administered 2017-07-18 – 2017-07-19 (×3): 3 g via INTRAVENOUS
  Filled 2017-07-18 (×6): qty 3

## 2017-07-18 NOTE — Consult Note (Signed)
PULMONARY / CRITICAL CARE MEDICINE   Name: Sara Scott MRN: [id number] DOB: [birth year]    ADMISSION DATE:  07/18/2017 CONSULTATION DATE:  8/16  REFERRING MD:  Dr. Preston Fleeting EDP  CHIEF COMPLAINT:  Unresponsive  HISTORY OF PRESENT ILLNESS:   36 year old female with past medical history as below, which is significant for depression and anxiety. EMS was dispatched her house 8/16 in the early a.m. hours for complaints of a "allergic reaction". Upon EMS arrival she was noted to be minimally responsive with respiratory depression. O2 sats initially in the mid 40s. She is provided bag valve mask ventilation and given Narcan to which she responded and vomited and possibly aspirated. Upon arrival to the emergency department she was given additional dose of Narcan and she was able to wake up and be cooperative with staff. She admits to taking 2 oxycodone for tooth pain as well as xanax, cocaine, and marijuana.  The plan was for narcan infusion and PCCM was consulted for ICu admit.   PAST MEDICAL HISTORY :  She  has a past medical history of Anxiety and Depression.  PAST SURGICAL HISTORY: She  has a past surgical history that includes Mouth surgery and Cholecystectomy (N/A, 05/30/2015).  Allergies  Allergen Reactions  . Shellfish Allergy Anaphylaxis and Hives  . Nickel Hives and Rash  . Silver Hives and Rash    No current facility-administered medications on file prior to encounter.    Current Outpatient Prescriptions on File Prior to Encounter  Medication Sig  . MIRENA 20 MCG/24HR IUD 1 each by Intrauterine route once.   . ranitidine (ZANTAC) 150 MG tablet Take 150 mg by mouth 2 (two) times daily as needed for heartburn.     FAMILY HISTORY:  Her has no family status information on file.    SOCIAL HISTORY: She  reports that she has been smoking Cigarettes.  She has never used smokeless tobacco. She reports that she uses drugs, including Marijuana and Cocaine. She reports that she does not  drink alcohol.  REVIEW OF SYSTEMS:   Bolds are positive  Constitutional: weight loss, gain, night sweats, fevers, chills, fatigue .  HEENT: headaches, Sore throat, sneezing, nasal congestion, post nasal drip, Difficulty swallowing, Tooth/dental problems, visual complaints visual changes, ear ache. L upper dental pain CV:  chest pain, radiates:Orthopnea, PND, swelling in lower extremities, dizziness, palpitations, syncope.  GI  heartburn, indigestion, abdominal pain, nausea, vomiting, diarrhea, change in bowel habits, loss of appetite, bloody stools.  Resp: cough, productive:, hemoptysis, dyspnea, chest pain, pleuritic.  Skin: rash or itching or icterus GU: dysuria, change in color of urine, urgency or frequency. flank pain, hematuria  MS: joint pain or swelling. decreased range of motion  Psych: change in mood or affect. depression or anxiety.  Neuro: difficulty with speech, weakness, numbness, ataxia    SUBJECTIVE:    VITAL SIGNS: BP 130/83   Pulse (!) 102   Temp (!) 97.5 F (36.4 C) (Axillary)   Resp (!) 26   SpO2 97%   HEMODYNAMICS:    VENTILATOR SETTINGS:    INTAKE / OUTPUT: No intake/output data recorded.  PHYSICAL EXAMINATION: General:  Young obese female in NAD Neuro:  Alert, oriented, non-focal HEENT:  [place]/AT, PERRL, no JVD Cardiovascular:  RRR, no MRG Lungs:  Bilateral rhonchi in bases Abdomen:  Soft, non-tender, non-distended Musculoskeletal:  No acute deformity or ROM limitation Skin:  Grossly intact  LABS:  BMET  Recent Labs Lab 07/18/17 0122  NA 137  K 4.2  CL 102  CO2 26  BUN 7  CREATININE 1.18*  GLUCOSE 248*    Electrolytes  Recent Labs Lab 07/18/17 0122  CALCIUM 8.7*    CBC  Recent Labs Lab 07/18/17 0122  WBC 27.1*  HGB 12.2  HCT 36.6  PLT 295    Coag's No results for input(s): APTT, INR in the last 168 hours.  Sepsis Markers  Recent Labs Lab 07/18/17 0130 07/18/17 0547  LATICACIDVEN 2.92* 2.55*     ABG  Recent Labs Lab 07/18/17 0103 07/18/17 0512  PHART 7.243* 7.278*  PCO2ART 58.2* 54.0*  PO2ART 64.0* 73.0*    Liver Enzymes  Recent Labs Lab 07/18/17 0122  AST 32  ALT 22  ALKPHOS 57  BILITOT 0.3  ALBUMIN 3.4*    Cardiac Enzymes No results for input(s): TROPONINI, PROBNP in the last 168 hours.  Glucose No results for input(s): GLUCAP in the last 168 hours.  Imaging Ct Head Wo Contrast  Result Date: 07/18/2017 CLINICAL DATA:  Altered level of consciousness. EXAM: CT HEAD WITHOUT CONTRAST TECHNIQUE: Contiguous axial images were obtained from the base of the skull through the vertex without intravenous contrast. COMPARISON:  None available, examination interpretedduring PACS downtime. FINDINGS: BRAIN: No intraparenchymal hemorrhage, mass effect nor midline shift. The ventricles and sulci are normal. No acute large vascular territory infarcts. No abnormal extra-axial fluid collections. Basal cisterns are patent. VASCULAR: Unremarkable. SKULL/SOFT TISSUES: No skull fracture. No significant soft tissue swelling. ORBITS/SINUSES: The included ocular globes and orbital contents are normal.Dense soft tissue opacifies the RIGHT maxillary sinus with mild atresia, no antral expansion. Mild general paranasal sinus mucosal thickening. Mastoid air cells are well aerated. OTHER: None. IMPRESSION: 1. Normal noncontrast CT HEAD. 2. Pan paranasal sinusitis including chronic RIGHT maxillary sinusitis with inspissated contents versus fungal sinusitis. Electronically Signed   By: Awilda Metroourtnay  Bloomer M.D.   On: 07/18/2017 01:44   Dg Chest Port 1 View  Result Date: 07/18/2017 CLINICAL DATA:  Acute onset of decreased O2 saturation. Vomiting. Assess for aspiration. Initial encounter. EXAM: PORTABLE CHEST 1 VIEW COMPARISON:  None. FINDINGS: The lungs are well-aerated and clear. There is no evidence of focal opacification, pleural effusion or pneumothorax. The cardiomediastinal silhouette is within  normal limits. No acute osseous abnormalities are seen. IMPRESSION: No acute cardiopulmonary process seen.  No evidence for aspiration. Electronically Signed   By: Roanna RaiderJeffery  Chang M.D.   On: 07/18/2017 01:15     STUDIES:  CT head 8/16 > Pan paranasal sinusitis including chronic RIGHT maxillary sinusitis with inspissated contents versus fungal sinusitis.  CULTURES: BCx2 8/16 > Sputum 8/16 >  ANTIBIOTICS: Unasyn 8/16 >  SIGNIFICANT EVENTS: 816 admit on narcan infusion  LINES/TUBES:   ASSESSMENT / PLAN:  Acute toxic encephalopathy: responded to narcan and BVM ventilation. UDS negative for opiates, however, positive for benzodiazepines, cocaine, and THC. CT head wnl with the exception of sinusitis. She is now awake and oriented without narcan infusion. ABG, Lactic improving.  - No need for narcan infusion at this time.  - Appropriate for stepdown admission/obs under hospitalist care at this time.  - Danger of substance abuse reinforced.   Possible aspiration pneumonia - Empiric unasyn as WBC elevated.  - PCT - Repeat CXR in AM - Low threshold to DC abx if PCT neg and consolidation does not develop.  Dental pain - Outpatient follow up  Joneen RoachPaul Hoffman, AGACNP-BC [facility] Pager [phone] or [phone]  07/18/2017 8:11 AM   STAFF NOTE: Cindi CarbonI, Daniel Feinstein, MD FACP have personally reviewed patient's  available data, including medical history, events of note, physical examination and test results as part of my evaluation. I have discussed with resident/NP and other care providers such as pharmacist, RN and RRT. In addition, I personally evaluated patient and elicited key findings of: awake, no distress, green hair, no stridor, lungs coarse left greater rt, abdo obese soft, no edema, no rash, pcxr which I reviewed showed hilar prominence  Concerning for asp vomit, she has acid breath, was found unresponsive with vomit aspiration event, appears to be OD  poly with Oxycodone, benzo, cocaine and THC she admits to, her ABg is improving and she does not require narcan or NIMV at this stage, she has ATN , hypovolemia, add fluids and resus and follow crt closely, admit to SDU, unasyn , assess sputum, I updated the pt infull, ecg is neg st changes, would asses trop The patient is critically ill with multiple organ systems failure and requires high complexity decision making for assessment and support, frequent evaluation and titration of therapies, application of advanced monitoring technologies and extensive interpretation of multiple databases.   Critical Care Time devoted to patient care services described in this note is 35 Minutes. This time reflects time of care of this signee: Rory Percy, MD FACP. This critical care time does not reflect procedure time, or teaching time or supervisory time of PA/NP/Med student/Med Resident etc but could involve care discussion time. Rest per NP/medical resident whose note is outlined above and that I agree with   Mcarthur Rossetti. Tyson Alias, MD, FACP Pgr: [phone] [facility] 07/18/2017 8:40 AM

## 2017-07-18 NOTE — ED Triage Notes (Signed)
Pt BIB EMS from home for allergic reaction. Per EMS, pt breathing 3 per minute and sats 41% on RA; placed NR and OPA and NPA without gag. EMS gave 1 mg narcan; pt pulled out airways and and vomitted; potentially aspirated. Pt alert to speech on arrival. Nonverbal. 1 mg narcan given per EDP. Pt now alert and speaking; denies taking opiates. EDP made aware.   EDP already assessed; RT at bedside.

## 2017-07-18 NOTE — ED Notes (Addendum)
Pt ambulated on room air to restroom with steady, even gait with this RN as stand by assist. When returned to room pt reports feeling hot and sweaty, O2 sats 88% on RA, HR 135.  Pt back on 2L O2, satting 94%, HR 105.

## 2017-07-18 NOTE — ED Notes (Signed)
Pt sats 93% on 4 L [place]. Transported to CT.

## 2017-07-18 NOTE — ED Notes (Signed)
Patient transported to X-ray 

## 2017-07-18 NOTE — ED Notes (Signed)
Attempted to take pt off O2 for trial, pt sats dropped to mid 80's EDP aware. Pt placed back on 3L [place].

## 2017-07-18 NOTE — ED Notes (Signed)
Before administration of Narcan I explained the indication for the medicine. Pt still continues to deny any drug use and states "Im just in shock about what you all are telling me"

## 2017-07-18 NOTE — H&P (Signed)
Date: 07/18/2017               Patient Name:  Sara MccreedyRhonda Nin MRN: [id number]  DOB: 1981/03/01 Age / Sex: 36 y.o., female   PCP: Daylene KatayamaGordnier, Hilary, PA         Medical Service: Internal Medicine Teaching Service         Attending Physician: Dr. Doneen PoissonKlima, Lawrence, MD    First Contact: Dr. Rozann LeschesMarybeth Daniel Johndrow Pager: [phone]  Second Contact: Dr. Valentino NoseNathan Boswell Pager: [phone]       After Hours (After 5p/  First Contact Pager: [phone]  weekends / holidays): Second Contact Pager: [phone]   Chief Complaint: Unresponsiveness  History of Present Illness: Sara Scott is 36 yo with a PMH of depression, anxiety, and chronic allergies who presented to the ED after she was found to be unresponsive by her sister and fiance the night of presentation. The patient was reportedly at baseline around 8:30 or 9:00pm. She was then found on the floor a few hours later. Her sister states that patient's her fingers and lips were blue when they found her. The patient also had a foamy liquid in her mouth, which her sister worried was vomit. Her sister reportedly rolled the patient on her side and the patient reportedly vomited again. The patient's sister also noted that the patient's neck looked swollen during this time and she was worried that the patient could be having an allergic reaction. In addition the sister reported that the patient's breathing looked labored during this time. The sister and fiance then called EMS for help.  The patient does not remember any of the above episode. She states that she had recently been "partying" at a high school reunion over the weekend and the party continued into the week. She states that yesterday around 5:00pm or so she did 1-2 lines of cocaine. She also endorses drinking 4 Modelo beers during the day. She reports taking 2 Xanax every day for anxiety and knows she took this medicine yesterday. The patient also reports taking two oxycodone (reports they are 7.5mg  or 10 mg)  for a toothache before she went to bed. She does not normally take this medicine every day, but had some pills left over from previous dental work she had done recently.  Meds:  Current Meds  Medication Sig  . azelastine (ASTELIN) 0.1 % nasal spray Place 2 sprays into both nostrils 2 (two) times daily. Use in each nostril as directed  . busPIRone (BUSPAR) 15 MG tablet Take 15 mg by mouth 2 (two) times daily.  . cetirizine (ZYRTEC) 10 MG tablet Take 10 mg by mouth daily.  . clonazePAM (KLONOPIN) 0.5 MG tablet Take 0.5 mg by mouth 2 (two) times daily.  Marland Kitchen. MIRENA 20 MCG/24HR IUD 1 each by Intrauterine route once.   . mometasone (ELOCON) 0.1 % ointment Apply 1 application topically daily as needed (dermatosis).  . montelukast (SINGULAIR) 10 MG tablet Take 10 mg by mouth at bedtime.  Marland Kitchen. olopatadine (PATANOL) 0.1 % ophthalmic solution Place 1 drop into both eyes daily.  Marland Kitchen. PARoxetine (PAXIL) 40 MG tablet Take 40 mg by mouth every morning.  . propranolol (INDERAL) 20 MG tablet Take 20 mg by mouth 3 (three) times daily as needed (anxiety).  . ranitidine (ZANTAC) 150 MG tablet Take 150 mg by mouth 2 (two) times daily as needed for heartburn.    Allergies: Allergies as of 07/18/2017 - Review Complete 07/18/2017  Allergen Reaction Noted  . Shellfish allergy  Anaphylaxis and Hives 12/27/2012  . Nickel Hives and Rash 06/25/2016  . Silver Hives and Rash 06/25/2016   Past Medical History: Past Medical History:  Diagnosis Date  . Allergy   . Anxiety   . Depression    Past Surgical History: Past Surgical History:  Procedure Laterality Date  . CHOLECYSTECTOMY N/A 05/30/2015   Procedure: LAPAROSCOPIC CHOLECYSTECTOMY;  Surgeon: Emelia Loron, MD;  Location: [facility];  Service: General;  Laterality: N/A;  . MOUTH SURGERY     Family History:  Hypertension in mother and father  Social History:  Patient lives with fiance and 84 year old son. 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 She  admits to smoking marijuana frequently and cocaine yesterday. She drinks alcohol socially and does not smoke tobacco. She denies IV drug use.  Review of Systems: A complete ROS was negative except as per HPI.  Physical Exam: Blood pressure 108/73, pulse 95, temperature (!) 97.5 F (36.4 C), temperature source Axillary, resp. rate (!) 25, SpO2 97 %.   Physical Exam  Constitutional:  Patient laying comfortably in bed with nasal canula in place and in no acute respiratory distress.   Eyes: Conjunctivae are normal.  Pupils 5mm. Equally round and reactive to light. EOM intact.  Cardiovascular: Normal rate, regular rhythm, normal heart sounds and intact distal pulses.   No murmur heard. Pulmonary/Chest:  No signs of cyanosis on fingers or mouth. No clubbing. No accessory muscle use or nasal flaring. No wheezing. Crackles appreciated on RLL. Normal air movement without crackles appreciated in all other lung fields.  Abdominal: Soft. Bowel sounds are normal. She exhibits no distension. There is no tenderness.  Neurological:  Gross motor and sensation of upper and lower extremities intact bilaterally.  Skin: Skin is warm and dry. No rash noted. No erythema. No pallor.  Psychiatric: She has a normal mood and affect. Her behavior is normal. Thought content normal.  Patient denies SI/HI.   EKG: personally reviewed my interpretation is regular sinus rhythm without ST elevation.  CXR: personally reviewed my interpretation is no obscuring of cardiac borders consistent with acute infiltrate/aspiration.  Assessment & Plan by Problem: Active Problems:   Acute respiratory failure with hypoxia [facility])  Sara Scott is a 36 yo with a PMH of depression and anxiety who presented to the ED after she was found to be unresponsive with labored breathing, cyanosis of lips and fingers, and concern for aspiration. She was hypoxic to O2 saturation of 46%, with a RR of 3 times per minute, and poor color. Her  mental and respiratory status improved with a Narcan administration and subsequent Narcan infusion. She was admitted to the internal medicine teaching service and the specific problems addressed on admission are as follows:  Acute hypoxic respiratory failure with respiratory acidosis and toxic encephalopathy: The patient initially required a Narcan infusion for improvement of her respiratory and mental status, suggesting that an acute opioid overdose was contributing to her overall clinical picture. This is consistent with the patient's report of chronic opiate use, acute cocaine use, and acute intake of of oxycodone x2 a few hours prior to presentation. Also, her UDS was positive for cocaine, benzodiazepines, and THC. Her arterial BG showed respiratory acidosis to 7.24 (pCO2 = 58). The critical care team was consulted in the ED. By the time of interview by the admitting team she was alert, responsive, and saturating well on 1L [place]. Her Narcan infusion was discontinued upon transfer to the floor. The patient  was started on Unasyn for concern of aspiration, given the sister's report of possible vomiting while the patient was unresponsive at home. Her portable chest x ray did not show an aspiration pneumonia, however her PE had crackles at the right lung base. The patient's lactate was elevated to 2.9 on admission and dropped to 2.5 with IV hydration. No concern for SI/HI at this time. Ethanol, salicylate, and acetaminophen level negative on admission. [ ]  F/u pro calcitonin, blood cultures x 2, and troponin obtained on admission [ ]  F/u sputum culture and gram stain -Narcan 0.4 mg PRN for respiratory depression -Obtain 2 view Chest X ray -Continue IV Unasyn for concern of aspiration, pharmacy consulted and appreciated -CBC in AM  AKI: The patient's Cr elevated to 1.18 on admission. Per chart review Cr 0.8-0.9 at baseline. This is likely 2/2 dehydration, as the patient also had an elevated lactate which  improved with IV hydration. Will continue to monitor. -Continue NS + KCl 20 mEq @ 125 ml.hr -BMP in AM  Hx of allergies: The patient had a Head CT which was negative for acute injury, however showed chronic maxillary sinusitis consistent with the patient's report of allergies. Patient denies acute congestion, headaches, and sinus tenderness. Patient has elevated WBC to 27.1 on admission. This is likely 2/2 possible aspiration. There is not a current concern for acute infection, as the patient is afebrile and asymptomatic. If patient develops fever, consider this as possible source. -Continue home montelukast 10 mg and loratadine 10 mg daily  Hx of depression and anxiety: The patient endorses a history of PTSD, depression and anxiety. She states that she currently sees a psychiatrist every Monday to help with these problems. She also reports that she had thoughts of hurting herself when she was a teenager, but has not had these thoughts within the past few years.  -Hold home klonopin 0.5mg  BID 2/2 concern for respiratory depression. Can re-evaluate later in afternoon and provide 1 dose overnight to prevent withdrawal if no concern for respiratory depression. -Continue home paroxetine 40 mg and buspirone 15 mg BID  FEN/GI: [ ]  F/u Mg and Hemoglobin A1C obtained on admission -Home famotidine 20 mg, BID -Normal diet as patient is alert and oriented  VTE prophylaxis: -Lovenox while inpatient  Dispo: Admit patient to Observation with expected length of stay less than 2 midnights.  SignedRozann Lesches, MD 07/18/2017, 9:07 AM  Pager: [phone]

## 2017-07-18 NOTE — Progress Notes (Signed)
Pharmacy Antibiotic Note  Sara MccreedyRhonda Scott is a 36 y.o. female admitted on 07/18/2017 with possible aspiration PNA.  Plan: unasyn 3 g q8h F/u pct     Temp (24hrs), Avg:97.5 F (36.4 C), Min:97.5 F (36.4 C), Max:97.5 F (36.4 C)   Recent Labs Lab 07/18/17 0122 07/18/17 0130 07/18/17 0547  WBC 27.1*  --   --   CREATININE 1.18*  --   --   LATICACIDVEN  --  2.92* 2.55*    CrCl cannot be calculated (Unknown ideal weight.).    Allergies  Allergen Reactions  . Shellfish Allergy Anaphylaxis and Hives  . Nickel Hives and Rash  . Silver Hives and Rash   Sara BlissMichael Sara Scott, PharmD, BCPS, BCCCP Clinical Pharmacist Clinical phone for 07/18/2017 from 7a-3:30p: [phone] If after 3:30p, please call main pharmacy at: [phone] 07/18/2017 8:18 AM

## 2017-07-18 NOTE — ED Provider Notes (Signed)
[facility]-EMERGENCY DEPT Provider Note   CSN: [id number] Arrival date & time: 07/18/17  0046     History   Chief Complaint Chief Complaint  Patient presents with  . Allergic Reaction  . Respiratory Distress    HPI Sara Scott is a 36 y.o. female.  The history is provided by the EMS personnel. The history is limited by the condition of the patient (Unresponsive).  she was brought in by ambulance. EMS was called because of possible allergic reaction. EMS noted the patient feeling approximately 3 times a minute with poor color. Initial oxygen saturation 46%. The provided oxygen via bag-valve-mask respirations and administered naloxone with some improvement. Respirations increased, but patient still remained nonverbal.she is not known to have any narcotic prescriptions and does not have a history of drug abuse.  Past Medical History:  Diagnosis Date  . Anxiety   . Depression     There are no active problems to display for this patient.   Past Surgical History:  Procedure Laterality Date  . CHOLECYSTECTOMY N/A 05/30/2015   Procedure: LAPAROSCOPIC CHOLECYSTECTOMY;  Surgeon: Emelia Loron, MD;  Location: [facility] OR;  Service: General;  Laterality: N/A;  . MOUTH SURGERY      OB History    No data available       Home Medications    Prior to Admission medications   Medication Sig Start Date End Date Taking? Authorizing Provider  betamethasone dipropionate (DIPROLENE) 0.05 % ointment Apply topically 2 (two) times daily. Patient taking differently: Apply 1 application topically 2 (two) times daily as needed (rash).  09/13/15   Barrett Henle, PA-C  escitalopram (LEXAPRO) 10 MG tablet Take 10 mg by mouth daily as needed (depression).     [provider]  fluticasone (FLONASE) 50 MCG/ACT nasal spray Place 2 sprays into both nostrils 2 (two) times daily.    [provider]  loratadine (CLARITIN) 10 MG tablet Take 10 mg by mouth 2 (two) times daily.     [provider]  MIRENA 20 MCG/24HR IUD 1 each by Intrauterine route once.  03/22/15   [provider]  Multiple Vitamin (MULTIVITAMIN WITH MINERALS) TABS Take 1 tablet by mouth daily.    [provider]  predniSONE (DELTASONE) 20 MG tablet Take 2 tablets (40 mg total) by mouth daily. 06/25/16   Arthor Captain, PA-C  ranitidine (ZANTAC) 150 MG tablet Take 150 mg by mouth 2 (two) times daily as needed for heartburn.     [provider]    Family History No family history on file.  Social History Social History  Substance Use Topics  . Smoking status: Light Tobacco Smoker    Types: Cigarettes  . Smokeless tobacco: Never Used  . Alcohol use No     Allergies   Shellfish allergy; Nickel; and Silver   Review of Systems Review of Systems  Unable to perform ROS: Patient unresponsive     Physical Exam Updated Vital Signs BP 111/61   Pulse (!) 101   Temp (!) 97.5 F (36.4 C) (Axillary)   Resp (!) 25   SpO2 98%   Physical Exam  Nursing note and vitals reviewed.  36 year old female, resting comfortably and in no acute distress. Vital signs are significant for borderline tachycardia, and for tachypnea. Oxygen saturation is 91%, which is hypoxic. Head is normocephalic and atraumatic. PERRLA, EOMI. Oropharynx is clear. Fundi show no hemorrhage, exudate, or papilledema. Neck is nontender and supple without adenopathy or JVD. Back is nontender and  there is no CVA tenderness. Lungs are clear without rales, wheezes, or rhonchi. Chest is nontender. Heart has regular rate and rhythm without murmur. Abdomen is soft, flat, nontender without masses or hepatosplenomegaly and peristalsis is normoactive. Extremities have no cyanosis or edema, full range of motion is present. Skin is warm and dry without rash. Neurologic: she is sleepy but does open her eyes to voice and to pain. She does look at me, but does not follow commands and is completely nonverbal.  Cranial nerves are intact, there are no gross motor or sensory deficits.  ED Treatments / Results  Labs (all labs ordered are listed, but only abnormal results are displayed) Labs Reviewed  COMPREHENSIVE METABOLIC PANEL - Abnormal; Notable for the following:       Result Value   Glucose, Bld 248 (*)    Creatinine, Ser 1.18 (*)    Calcium 8.7 (*)    Albumin 3.4 (*)    GFR calc non Af Amer 58 (*)    All other components within normal limits  ACETAMINOPHEN LEVEL - Abnormal; Notable for the following:    Acetaminophen (Tylenol), Serum <10 (*)    All other components within normal limits  CBC WITH DIFFERENTIAL/PLATELET - Abnormal; Notable for the following:    WBC 27.1 (*)    Neutro Abs 19.8 (*)    Lymphs Abs 5.4 (*)    Monocytes Absolute 1.4 (*)    All other components within normal limits  RAPID URINE DRUG SCREEN, HOSP PERFORMED - Abnormal; Notable for the following:    Cocaine POSITIVE (*)    Benzodiazepines POSITIVE (*)    Tetrahydrocannabinol POSITIVE (*)    All other components within normal limits  URINALYSIS, ROUTINE W REFLEX MICROSCOPIC - Abnormal; Notable for the following:    APPearance HAZY (*)    Glucose, UA >=500 (*)    Hgb urine dipstick SMALL (*)    Squamous Epithelial / LPF 0-5 (*)    All other components within normal limits  I-STAT CG4 LACTIC ACID, ED - Abnormal; Notable for the following:    Lactic Acid, Venous 2.92 (*)    All other components within normal limits  I-STAT ARTERIAL BLOOD GAS, ED - Abnormal; Notable for the following:    pH, Arterial 7.243 (*)    pCO2 arterial 58.2 (*)    pO2, Arterial 64.0 (*)    Acid-base deficit 3.0 (*)    All other components within normal limits  I-STAT ARTERIAL BLOOD GAS, ED - Abnormal; Notable for the following:    pH, Arterial 7.278 (*)    pCO2 arterial 54.0 (*)    pO2, Arterial 73.0 (*)    All other components within normal limits  I-STAT CG4 LACTIC ACID, ED - Abnormal; Notable for the following:    Lactic Acid,  Venous 2.55 (*)    All other components within normal limits  SALICYLATE LEVEL  ETHANOL  I-STAT BETA HCG BLOOD, ED ([facility], [facility], [facility] ONLY)    EKG  EKG Interpretation  Date/Time:  Thursday July 18 2017 00:51:23 EDT Ventricular Rate:  91 PR Interval:    QRS Duration: 78 QT Interval:  375 QTC Calculation: 462 R Axis:   57 Text Interpretation:  Sinus rhythm Normal ECG No old tracing to compare Confirmed by Dione BoozeGlick, Melana Hingle ([id number]) on 07/18/2017 1:01:34 AM       Radiology Ct Head Wo Contrast  Result Date: 07/18/2017 CLINICAL DATA:  Altered level of consciousness. EXAM: CT HEAD WITHOUT CONTRAST TECHNIQUE: Contiguous axial images were  obtained from the base of the skull through the vertex without intravenous contrast. COMPARISON:  None available, examination interpretedduring PACS downtime. FINDINGS: BRAIN: No intraparenchymal hemorrhage, mass effect nor midline shift. The ventricles and sulci are normal. No acute large vascular territory infarcts. No abnormal extra-axial fluid collections. Basal cisterns are patent. VASCULAR: Unremarkable. SKULL/SOFT TISSUES: No skull fracture. No significant soft tissue swelling. ORBITS/SINUSES: The included ocular globes and orbital contents are normal.Dense soft tissue opacifies the RIGHT maxillary sinus with mild atresia, no antral expansion. Mild general paranasal sinus mucosal thickening. Mastoid air cells are well aerated. OTHER: None. IMPRESSION: 1. Normal noncontrast CT HEAD. 2. Pan paranasal sinusitis including chronic RIGHT maxillary sinusitis with inspissated contents versus fungal sinusitis. Electronically Signed   By: Awilda Metro M.D.   On: 07/18/2017 01:44   Dg Chest Port 1 View  Result Date: 07/18/2017 CLINICAL DATA:  Acute onset of decreased O2 saturation. Vomiting. Assess for aspiration. Initial encounter. EXAM: PORTABLE CHEST 1 VIEW COMPARISON:  None. FINDINGS: The lungs are well-aerated and clear. There is no evidence of focal opacification,  pleural effusion or pneumothorax. The cardiomediastinal silhouette is within normal limits. No acute osseous abnormalities are seen. IMPRESSION: No acute cardiopulmonary process seen.  No evidence for aspiration. Electronically Signed   By: Roanna Raider M.D.   On: 07/18/2017 01:15    Procedures Procedures (including critical care time) CRITICAL CARE Performed by: RUEAV,WUJWJ Total critical care time: 120 minutes Critical care time was exclusive of separately billable procedures and treating other patients. Critical care was necessary to treat or prevent imminent or life-threatening deterioration. Critical care was time spent personally by me on the following activities: development of treatment plan with patient and/or surrogate as well as nursing, discussions with consultants, evaluation of patient's response to treatment, examination of patient, obtaining history from patient or surrogate, ordering and performing treatments and interventions, ordering and review of laboratory studies, ordering and review of radiographic studies, pulse oximetry and re-evaluation of patient's condition.  Medications Ordered in ED Medications  naloxone (NARCAN) injection 1 mg (not administered)     Initial Impression / Assessment and Plan / ED Course  I have reviewed the triage vital signs and the nursing notes.  Pertinent labs & imaging results that were available during my care of the patient were reviewed by me and considered in my medical decision making (see chart for details).  Patient with altered mental status with partial response and Lanoxin. She'll be given additional naloxone, and workup initiated for altered mental status. Old records are reviewed showing recent psychiatry referral for generalized anxiety disorder, but no history of suicide attempts and no history of suicidal ideation and no history of drug abuse.  Following additional dose of naloxone, she is more awake and is now able to  answer questions. Her husband has arrived and states that they were watching television when she became somnolent. She denies drug use other than marijuana. She has no narcotic prescriptions and has not had any change in her medications.  Patient was observed in the ED and was maintaining adequate oxygen saturations. However, after 5 hours of observation, when she was taken off of oxygen, oxygen saturations dropped into the 80s. Repeat ABG showed ongoing respiratory acidosis It is presumed narcotic overdose based on response to naloxone. She's given additional oxygen and placed on a naloxone drip. Drug screen has come back positive for cocaine and marijuana and benzodiazepines. I asked the patient directly about drug use and she admits to smoking marijuana last  night. She wonders if it might of been laced with something. She denies cocaine use in spite of positive drug screen. Case is discussed with Dr. Tyson Alias of critical care service who agrees to diet the patient for admission to ICU.  Final Clinical Impressions(s) / ED Diagnoses   Final diagnoses:  Overdose, accidental or unintentional, initial encounter  Acute respiratory failure with hypoxia and hypercapnia (HCC)    New Prescriptions New Prescriptions   No medications on file     Dione Booze, MD 07/18/17 [phone]

## 2017-07-18 NOTE — ED Notes (Signed)
Admitting paged about bp  

## 2017-07-19 ENCOUNTER — Observation Stay (HOSPITAL_COMMUNITY): Payer: Medicaid Other

## 2017-07-19 DIAGNOSIS — J9601 Acute respiratory failure with hypoxia: Secondary | ICD-10-CM | POA: Diagnosis not present

## 2017-07-19 LAB — COMPREHENSIVE METABOLIC PANEL
ALT: 17 U/L (ref 14–54)
ANION GAP: 7 (ref 5–15)
AST: 16 U/L (ref 15–41)
Albumin: 3 g/dL — ABNORMAL LOW (ref 3.5–5.0)
Alkaline Phosphatase: 52 U/L (ref 38–126)
BUN: 7 mg/dL (ref 6–20)
CHLORIDE: 108 mmol/L (ref 101–111)
CO2: 23 mmol/L (ref 22–32)
Calcium: 8.5 mg/dL — ABNORMAL LOW (ref 8.9–10.3)
Creatinine, Ser: 0.87 mg/dL (ref 0.44–1.00)
GFR calc non Af Amer: >60 mL/min (ref >60)
Glucose, Bld: 99 mg/dL (ref 65–99)
Potassium: 3.9 mmol/L (ref 3.5–5.1)
SODIUM: 138 mmol/L (ref 135–145)
Total Bilirubin: 0.4 mg/dL (ref 0.3–1.2)
Total Protein: 6.7 g/dL (ref 6.5–8.1)

## 2017-07-19 LAB — CBC
HEMATOCRIT: 34.2 % — AB (ref 36.0–46.0)
HEMOGLOBIN: 11.3 g/dL — AB (ref 12.0–15.0)
MCH: 28 pg (ref 26.0–34.0)
MCHC: 33 g/dL (ref 30.0–36.0)
MCV: 84.7 fL (ref 78.0–100.0)
Platelets: 246 10*3/uL (ref 150–400)
RBC: 4.04 MIL/uL (ref 3.87–5.11)
RDW: 13.9 % (ref 11.5–15.5)
WBC: 14 10*3/uL — ABNORMAL HIGH (ref 4.0–10.5)

## 2017-07-19 LAB — HIV ANTIBODY (ROUTINE TESTING W REFLEX): HIV Screen 4th Generation wRfx: NONREACTIVE

## 2017-07-19 LAB — GLUCOSE, CAPILLARY: Glucose-Capillary: 94 mg/dL (ref 65–99)

## 2017-07-19 MED ORDER — CLONAZEPAM 0.5 MG PO TABS: 0.5000 mg | ORAL_TABLET | Freq: Two times a day (BID) | ORAL | 0 refills | Status: AC | PRN

## 2017-07-19 MED ORDER — GUAIFENESIN 200 MG PO TABS
200.0000 mg | ORAL_TABLET | ORAL | Status: DC | PRN
  Administered 2017-07-19: 200 mg via ORAL
  Filled 2017-07-19 (×3): qty 1

## 2017-07-19 NOTE — Progress Notes (Signed)
   Subjective:  Sara Scott states that she feels well this morning and has no acute complaints. She states that the headache she had yesterday feels much better and improved with ibuprofen. She didn't have any trouble breathing overnight but continues to endorse a nonproductive cough and associated pain in her chest while coughing.  Objective:  Vital signs in last 24 hours: Vitals:   07/18/17 1628 07/18/17 1728 07/18/17 2148 07/19/17 0621  BP:   114/70 104/61  Pulse:   91 88  Resp:    18  Temp:   98 F (36.7 C) 98 F (36.7 C)  TempSrc:   Oral Oral  SpO2:  94% 97% 100%  Weight: 291 lb 1.6 oz (132 kg)     Height: 6' (1.829 m)      Physical Exam  Constitutional: She appears well-developed and well-nourished.  Patient sitting comfortably in bed in no acute distress.  Cardiovascular: Normal rate, regular rhythm, normal heart sounds and intact distal pulses.   No murmur heard. Pulmonary/Chest: Effort normal and breath sounds normal. No respiratory distress. She has no wheezes.  No crackles appreciated bilaterally  Abdominal: Soft. She exhibits no distension. There is no tenderness. There is no guarding.  Musculoskeletal: She exhibits no edema (of bilateral lower extremities) or tenderness (of bilateral lower extremities).  Skin: Skin is warm and dry. No rash noted. No erythema.  Psychiatric: She has a normal mood and affect. Her behavior is normal. Thought content normal.   Assessment/Plan:  Principal Problem:   Acute respiratory failure with hypoxia (HCC) Active Problems:   Polysubstance abuse   Overdose  Sara Sara Scott is a 36 yo with a PMH of depression and anxiety who presented to the ED after she was found to be unresponsive with labored breathing, cyanosis of lips and fingers, and concern for aspiration. Her mental and respiratory status improved with a Narcan administration. She was admitted to the internal medicine teaching service and the specific problems addressed on  admission are as follows:  Acute hypoxic respiratory failure with respiratory acidosis and toxic encephalopathy 2/2 polysubstance abuse: The patient initially required a Narcan infusion for improvement of her respiratory and mental status, suggesting that an acute opioid overdose was contributing to her overall clinical picture. This is consistent with the patient's report of chronic opiate use, acute cocaine use, and acute intake of of oxycodone x2 a few hours prior to presentation and her UDS which was positive for cocaine, benzos, and THC. Her arterial BG showed an acute respiratory acidosis to 7.24 (pCO2 = 58) on presentation with an A-a gradient consistent of aspiration pneumonitis. She was started on IV Unasyn in ED, but this was discontinued on HD#1 because the patient remained afebrile and her clinical status improved throughout admission.  -Patient stable on RA with no signs of respiratory depression  AKI: The patient's Cr elevated to 1.18 on admission. Cr returned to baseline of 0.87 overnight with maintenance fluids and PO intake.   Hx of allergies: Patient asymptomatic.  -Continue home montelukast 10 mg and loratadine 10 mg daily  Hx of depression and anxiety: Patient has been asymptomatic and did not require home Klonopin overnight. -Continue home paroxetine 40 mg and buspirone 15 mg BID  FEN/GI: -Home famotidine 20 mg, BID -Normal diet, encourage PO intake  VTE prophylaxis: -Lovenox while inpatient  Dispo: Anticipated discharge in approximately 1 day.   Rozann Lesches, MD 07/19/2017, 9:58 AM Pager: [phone] '

## 2017-07-19 NOTE — Discharge Summary (Signed)
Name: Sara Scott MRN: [id number] DOB: January 03, 1981 36 y.o. PCP: Daylene Katayama, PA  Date of Admission: 07/18/2017 12:47 AM Date of Discharge: 07/19/2017 Attending Physician: Doneen Poisson, MD  Discharge Diagnosis:  Principal Problem:   Acute respiratory failure with hypoxia [facility]) Active Problems:   Polysubstance abuse   Overdose  Discharge Medications: Allergies as of 07/19/2017      Reactions   Shellfish Allergy Anaphylaxis, Hives   Nickel Hives, Rash   Silver Hives, Rash      Medication List    TAKE these medications   azelastine 0.1 % nasal spray Commonly known as:  ASTELIN Place 2 sprays into both nostrils 2 (two) times daily. Use in each nostril as directed   busPIRone 15 MG tablet Commonly known as:  BUSPAR Take 15 mg by mouth 2 (two) times daily.   cetirizine 10 MG tablet Commonly known as:  ZYRTEC Take 10 mg by mouth daily.   clonazePAM 0.5 MG tablet Commonly known as:  KLONOPIN Take 1 tablet (0.5 mg total) by mouth 2 (two) times daily as needed for anxiety (Try to limit daily use of this medication as this is not a good long term medication for daily use). What changed:  when to take this  reasons to take this   MIRENA (52 MG) 20 MCG/24HR IUD Generic drug:  levonorgestrel 1 each by Intrauterine route once.   mometasone 0.1 % ointment Commonly known as:  ELOCON Apply 1 application topically daily as needed (dermatosis).   montelukast 10 MG tablet Commonly known as:  SINGULAIR Take 10 mg by mouth at bedtime.   olopatadine 0.1 % ophthalmic solution Commonly known as:  PATANOL Place 1 drop into both eyes daily.   PARoxetine 40 MG tablet Commonly known as:  PAXIL Take 40 mg by mouth every morning.   propranolol 20 MG tablet Commonly known as:  INDERAL Take 20 mg by mouth 3 (three) times daily as needed (anxiety).   ranitidine 150 MG tablet Commonly known as:  ZANTAC Take 150 mg by mouth 2 (two) times daily as needed for heartburn.        Disposition and follow-up:   [name] was discharged from [facility] in Good condition.  At the hospital follow up visit please address:  1.  The patient was admitted with AMS 2/2 polysubstance abuse. On presentation she was hypoxic with concern for aspiration pneumonia. She improved with Narcan infusion and showed no clinical signs worrisome for infection. Her clinical presentation was consistent with chemical aspiration pneumonitis. Please follow up regarding resolution of her symptoms and assess for clinical signs of infection.  2.  Labs / imaging needed at time of follow-up: None  3.  Pending labs/ test needing follow-up: None  Follow-up Appointments: Follow-up Information    [place] [place], [place], [place]. Schedule an appointment as soon as possible for a visit.   Specialty:  Family Medicine Why:  Please call to make an appointment to be seen next week to ensure resolution of your cough and that you are not developing a lung infection. Contact information: [address] [place] [address] [phone]           Hospital Course by problem list: Principal Problem:   Acute respiratory failure with hypoxia [facility]) Active Problems:   Polysubstance abuse   Overdose   Ms Sara Scott is a 36 yo with a PMH of depression and anxiety who presented to the ED after she was found to be unresponsive  with labored breathing, cyanosis of lips and fingers, and concern for aspiration. Her mental and respiratory status improved with a Narcan administration. She was admitted to the internal medicine teaching service and the specific problems addressed on admission are as follows:  Acute hypoxic respiratory failure with respiratory acidosis and toxic encephalopathy 2/2 polysubstance abuse: The patient presented with an O2 saturation of 47%, RR of 3 breaths per minute, and decreased responsiveness. She required a Narcan infusion for improvement of her  respiratory and mental status, suggesting that an acute drug overdose was contributing to her overall clinical picture. This is consistent with the patient's report of chronic benzodiazepine use, acute cocaine use, and acute intake of of oxycodone x2 on the day of presentation. An arterial BG showed an acute respiratory acidosis to 7.24 (pCO2 = 58) with an A-a gradient consistent with aspiration pneumonitis. She was started on IV Unasyn in ED, but this was discontinued on HD#1 because the patient remained afebrile, she had chest X ray that did not show a definite acute infiltration, and her clinical status improved throughout admission. She was discharged without additional antibiotic therapy given a low suspicion for bacterial aspiration pneumonia. We do believe she had a chemical pneumonitis and told her to follow up with her PCP to ensure resolution of symptoms and reassess for possible pneumonia.  AKI: The patient's Cr elevated to 1.18 on admission. Cr returned to baseline of 0.87 overnight with maintenance fluids and PO intake.   Hx of depression and anxiety: The patient endorses a history of PTSD, depression and anxiety. She states that she currently sees a psychiatrist every week to help with these problems. She previously had thoughts of hurting herself when she was a teenager, but has not had these thoughts within the past few years and did not endorse SI/HI during hospitalization. We continued home paroxetine 40 mg and buspirone 15 mg BID for depression/anxiety. We held home Klonopin during hospitalization and would recommend stopping this medication on discharge, given the patient's current hospitalization with acute hypoxia 2/2 polysubstance abuse.   Discharge Vitals:   BP 104/61 (BP Location: Right Arm)   Pulse 88   Temp 98 F (36.7 C) (Oral)   Resp 18   Ht 6' (1.829 m)   Wt 291 lb 1.6 oz (132 kg)   LMP  (LMP Unknown) Comment: pt states, no chance of pregnancy  SpO2 100%   BMI 39.48  kg/m   Pertinent Labs, Studies, and Procedures:  CMP Latest Ref Rng & Units 07/19/2017 07/18/2017 05/26/2015  Glucose 65 - 99 mg/dL 99 147(W) 295(A)  BUN 6 - 20 mg/dL 7 7 11   Creatinine 0.44 - 1.00 mg/dL 2.13 0.86(V) 7.84  Sodium 135 - 145 mmol/L 138 137 139  Potassium 3.5 - 5.1 mmol/L 3.9 4.2 4.3  Chloride 101 - 111 mmol/L 108 102 107  CO2 22 - 32 mmol/L 23 26 26   Calcium 8.9 - 10.3 mg/dL 6.9(G) 2.9(B) 9.2  Total Protein 6.5 - 8.1 g/dL 6.7 6.9 7.1  Total Bilirubin 0.3 - 1.2 mg/dL 0.4 0.3 0.6  Alkaline Phos 38 - 126 U/L 52 57 53  AST 15 - 41 U/L 16 32 30  ALT 14 - 54 U/L 17 22 34   CBC    Component Value Date/Time   WBC 14.0 (H) 07/19/2017 0539   RBC 4.04 07/19/2017 0539   HGB 11.3 (L) 07/19/2017 0539   HCT 34.2 (L) 07/19/2017 0539   PLT 246 07/19/2017 0539   MCV 84.7 07/19/2017  0539   MCH 28.0 07/19/2017 0539   MCHC 33.0 07/19/2017 0539   RDW 13.9 07/19/2017 0539   LYMPHSABS 5.4 (H) 07/18/2017 0122   MONOABS 1.4 (H) 07/18/2017 0122   EOSABS 0.5 07/18/2017 0122   BASOSABS 0.0 07/18/2017 0122   Urinalysis    Component Value Date/Time   COLORURINE YELLOW 07/18/2017 0251   APPEARANCEUR HAZY (A) 07/18/2017 0251   LABSPEC 1.016 07/18/2017 0251   PHURINE 5.0 07/18/2017 0251   GLUCOSEU >=500 (A) 07/18/2017 0251   HGBUR SMALL (A) 07/18/2017 0251   BILIRUBINUR NEGATIVE 07/18/2017 0251   KETONESUR NEGATIVE 07/18/2017 0251   PROTEINUR NEGATIVE 07/18/2017 0251   UROBILINOGEN 0.2 04/10/2015 1035   NITRITE NEGATIVE 07/18/2017 0251   LEUKOCYTESUR NEGATIVE 07/18/2017 0251   Urine Drugs of Abuse     Component Value Date/Time   LABOPIA NONE DETECTED 07/18/2017 0251   COCAINSCRNUR POSITIVE (A) 07/18/2017 0251   LABBENZ POSITIVE (A) 07/18/2017 0251   AMPHETMU NONE DETECTED 07/18/2017 0251   THCU POSITIVE (A) 07/18/2017 0251   LABBARB NONE DETECTED 07/18/2017 0251   HIV antibody = non-reactive Hemoglobin A1C = 5.9%  Arterial Blood gas  pH, Arterial 7.278 (L)    pCO2  arterial 54.0 (H) mmHg    pO2, Arterial 73.0 (L) mmHg    Bicarbonate 25.4 mmol/L    TCO2 27 mmol/L    O2 Saturation 92.0 %    Acid-base deficit 2.0 mmol/L    Patient temperature 97.9 F   CT Head w/o Contrast IMPRESSION: 1. Normal noncontrast CT HEAD. 2. Pan paranasal sinusitis including chronic RIGHT maxillary sinusitis with inspissated contents versus fungal sinusitis.  Chest Xray on admission FINDINGS: The lungs are well-aerated and clear. There is no evidence of focal opacification, pleural effusion or pneumothorax. The cardiomediastinal silhouette is within normal limits.  No acute osseous abnormalities are seen.  IMPRESSION: No acute cardiopulmonary process seen.  No evidence for aspiration.  Chest Xray on HD#1 FINDINGS: Mediastinum and hilar structures are normal. Heart size normal. Mild infiltrate right lung base cannot be excluded. Aspiration could present in this fashion. No pleural effusion or pneumothorax .  IMPRESSION: Mild infiltrate right lung base cannot be excluded. Aspiration could present this fashion.  Discharge Instructions: Discharge Instructions    Call MD for:  persistant dizziness or light-headedness    Complete by:  As directed    Call MD for:  persistant nausea and vomiting    Complete by:  As directed    Call MD for:  temperature >100.4    Complete by:  As directed    Diet - low sodium heart healthy    Complete by:  As directed    Discharge instructions    Complete by:  As directed    Please follow up with your PCP next week to ensure resolution of your cough symptoms and to ensure that you are not developing a lung infection as a result of your aspiration.   Increase activity slowly    Complete by:  As directed       Signed: Rozann Lesches, MD 07/19/2017, 10:45 AM   Pager: [phone]

## 2017-07-23 LAB — CULTURE, BLOOD (ROUTINE X 2)
CULTURE: NO GROWTH
CULTURE: NO GROWTH
SPECIAL REQUESTS: ADEQUATE
Special Requests: ADEQUATE

## 2017-08-30 ENCOUNTER — Ambulatory Visit (HOSPITAL_COMMUNITY)
Admission: EM | Admit: 2017-08-30 | Discharge: 2017-08-30 | Disposition: A | Discharge status: Home / Self Care | Payer: Self-pay | Attending: Internal Medicine | Admitting: Internal Medicine

## 2017-08-30 ENCOUNTER — Encounter (HOSPITAL_COMMUNITY): Payer: Self-pay | Admitting: Family Medicine

## 2017-08-30 DIAGNOSIS — T300 Burn of unspecified body region, unspecified degree: Secondary | ICD-10-CM

## 2017-08-30 NOTE — Discharge Instructions (Signed)
Daily dressings, use petroleum dressing or Vaseline for barrier. Follow-up with PCP for wound monitoring until wound is healed. Monitor for any spreading redness, increased warmth, increased pain, fever, follow-up for reevaluation.
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
## 2017-08-30 NOTE — ED Notes (Signed)
Applied petrolatum dressing, secured w/kling and coban

## 2018-05-18 IMAGING — CT CT HEAD W/O CM
4 series · 16 of 47 positions shown, 18 images · non-contrast
Comparison: None available, examination interpretedduring PACS
downtime.

CLINICAL DATA: Altered level of consciousness.

EXAM:
CT HEAD WITHOUT CONTRAST
TECHNIQUE: Contiguous axial images were obtained from the base of the skull
through the vertex without intravenous contrast.

[Series 3: head wo · axial · 0.44mm/px · z∈[-66,+54]mm · 7 of 34 slices shown, 9 images]
[im 5/34  brain]
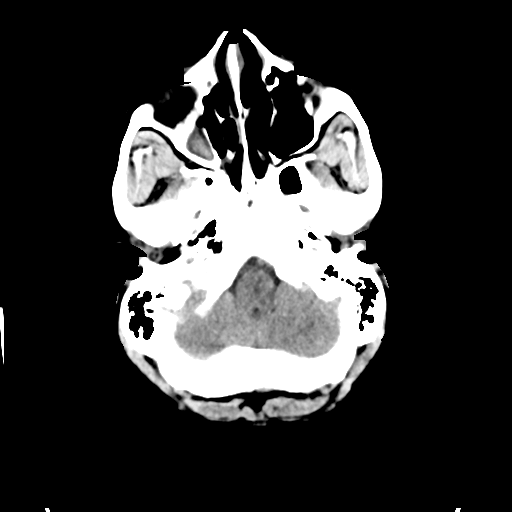
[im 5/34  bone]
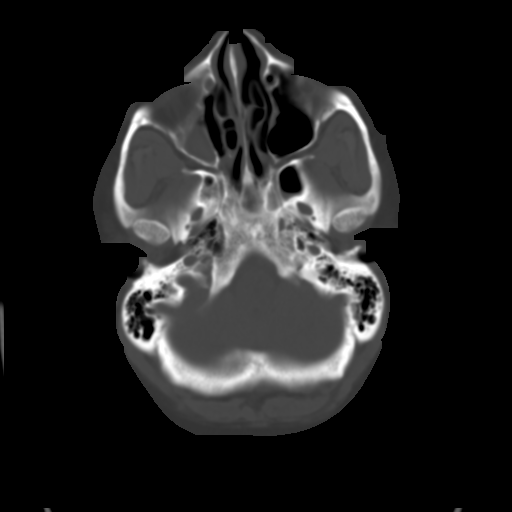
[im 9/34  brain]
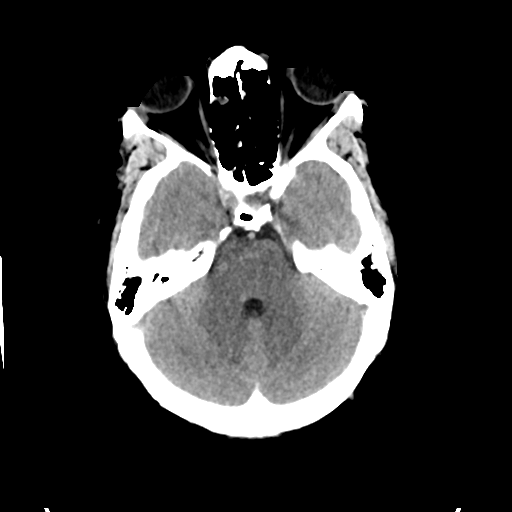
[im 13/34  brain]
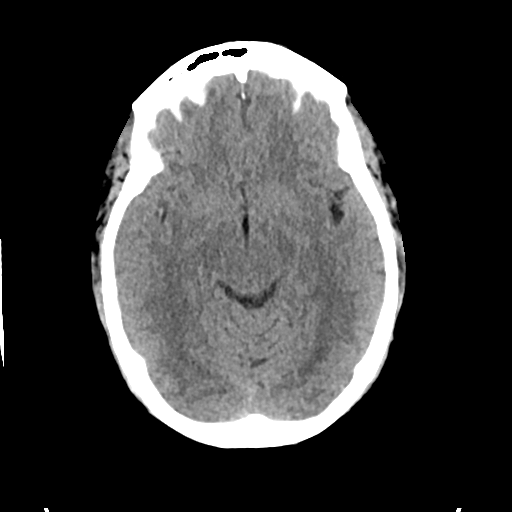
[im 17/34  brain]
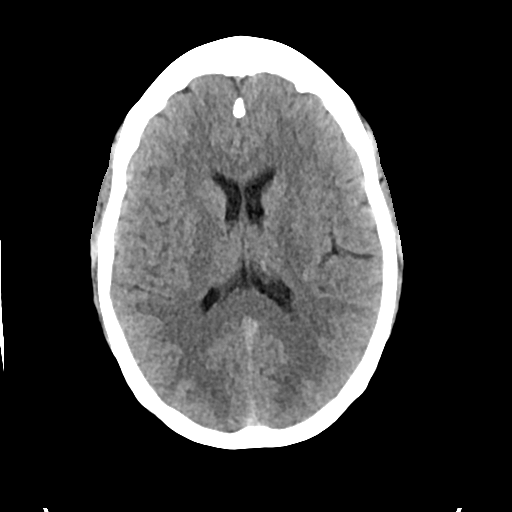
[im 21/34  brain]
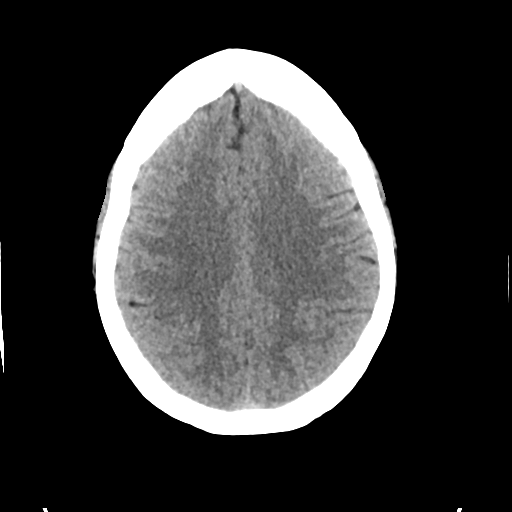
[im 21/34  bone]
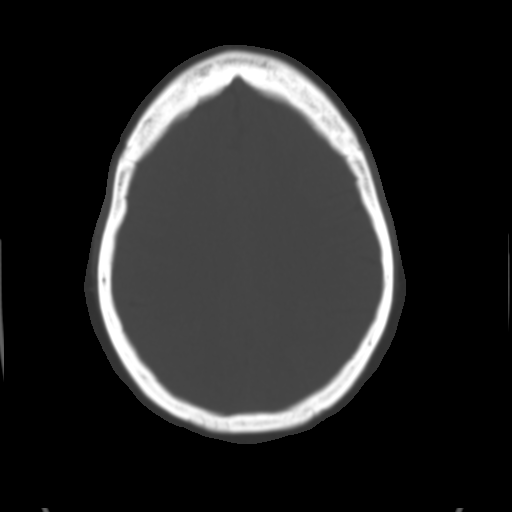
[im 25/34  brain]
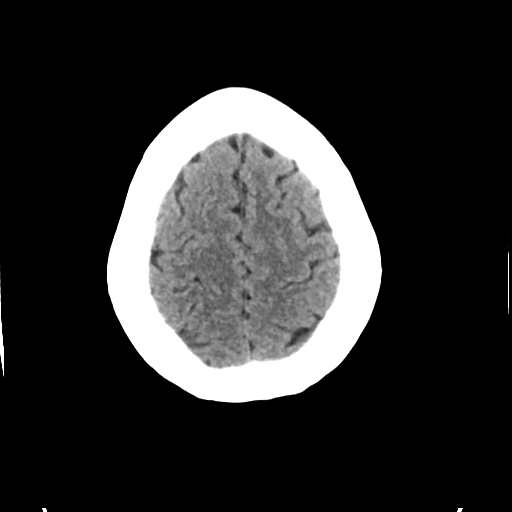
[im 29/34  brain]
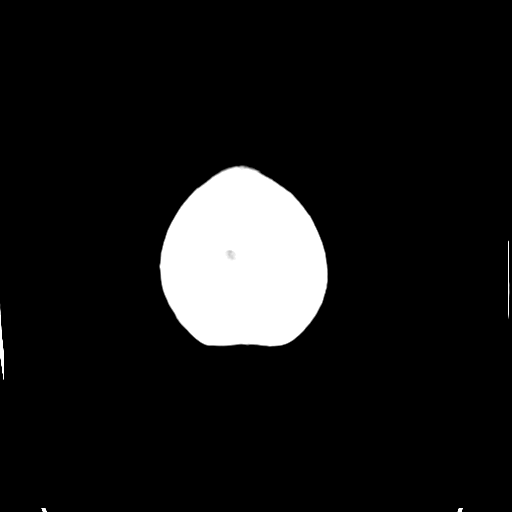

[Series 4: head bone · axial · 0.44mm/px · z∈[-70,-38]mm · 3 of 84 slices shown]
[im 9/84  bone]
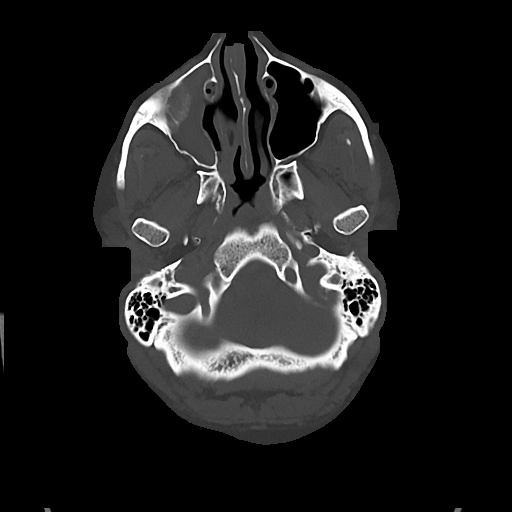
[im 17/84  bone]
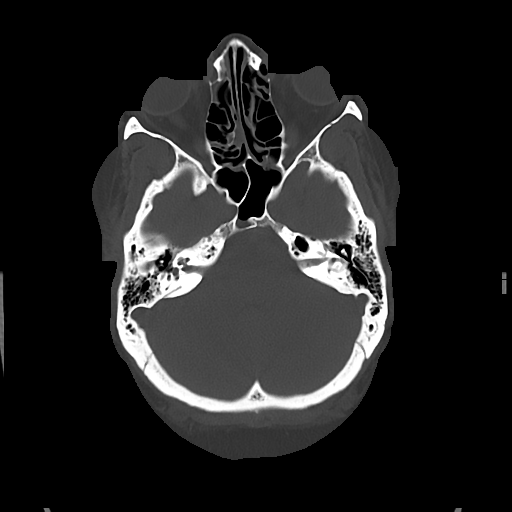
[im 25/84  bone]
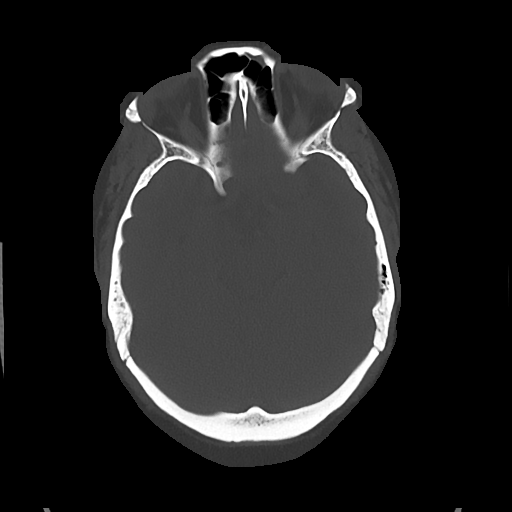

[Series 5: cor soft · coronal · 0.33mm/px · 3 of 71 slices shown]
[im 24/71  brain]
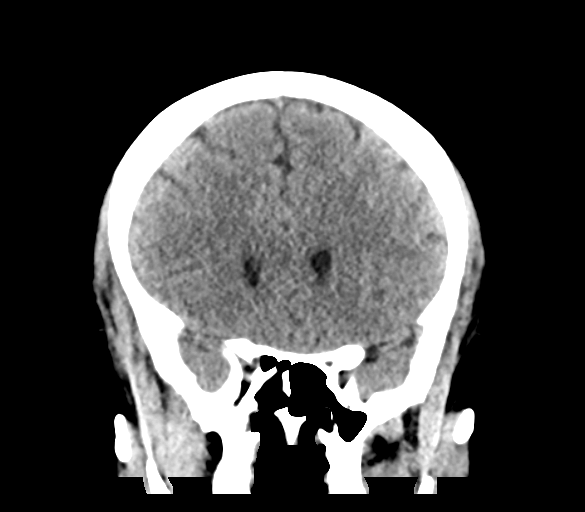
[im 32/71  brain]
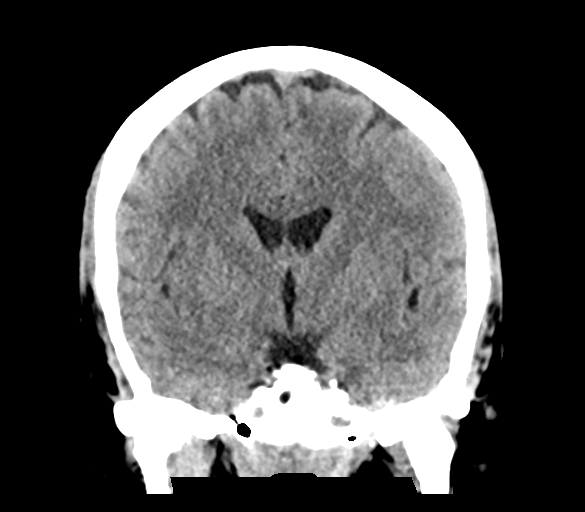
[im 39/71  brain]
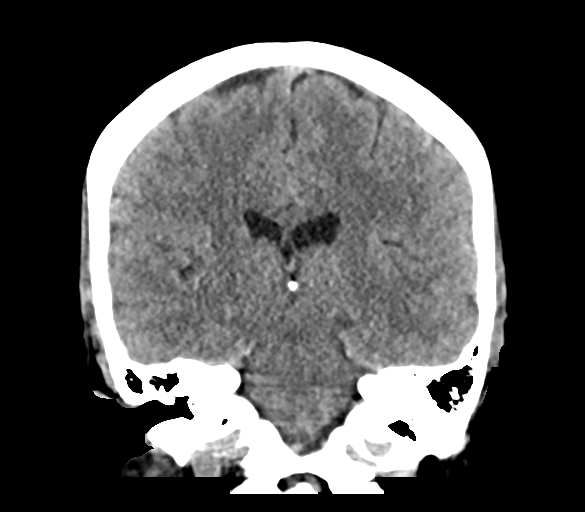

[Series 6: sag soft · sagittal · 0.35mm/px · 3 of 61 slices shown]
[im 21/61  brain]
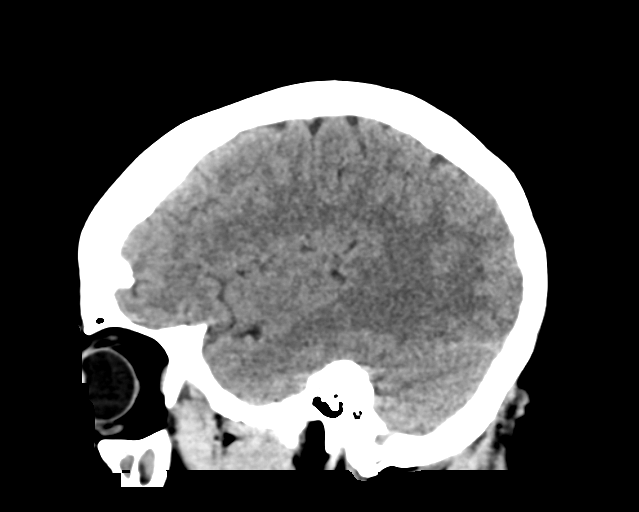
[im 31/61  brain]
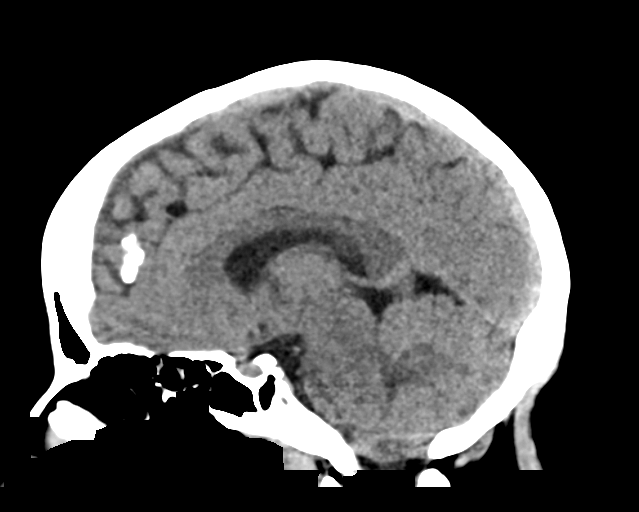
[im 41/61  brain]
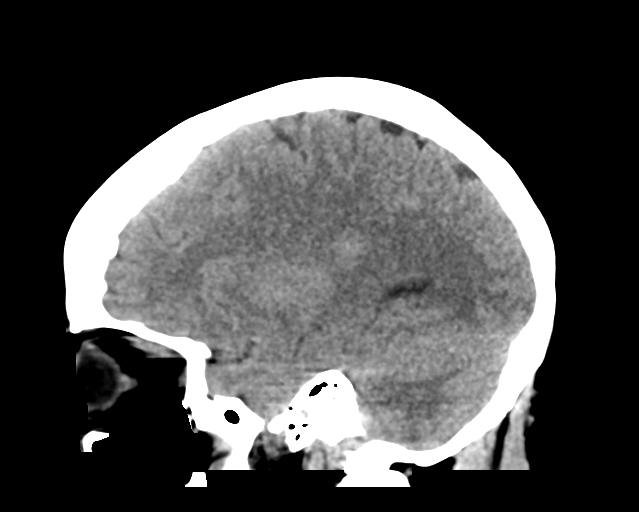

[16 of 47 positions shown; findings below may reference images not displayed]

FINDINGS: BRAIN: No intraparenchymal hemorrhage, mass effect nor midline
shift. The ventricles and sulci are normal. No acute large vascular
territory infarcts. No abnormal extra-axial fluid collections. Basal
cisterns are patent.

VASCULAR: Unremarkable.

SKULL/SOFT TISSUES: No skull fracture. No significant soft tissue
swelling.

ORBITS/SINUSES: The included ocular globes and orbital contents are
normal.Dense soft tissue opacifies the RIGHT maxillary sinus with
mild atresia, no antral expansion. Mild general paranasal sinus
mucosal thickening. Mastoid air cells are well aerated.

OTHER: None.
IMPRESSION: 1. Normal noncontrast CT HEAD.
2. Pan paranasal sinusitis including chronic RIGHT maxillary
sinusitis with inspissated contents versus fungal sinusitis.

## 2018-05-19 IMAGING — DX DG CHEST 1V PORT
1 series · 1 of 1 positions shown · non-contrast
Comparison: 07/18/2017 .

CLINICAL DATA: Aspiration pneumonia.

EXAM:
PORTABLE CHEST 1 VIEW

[chest ap]
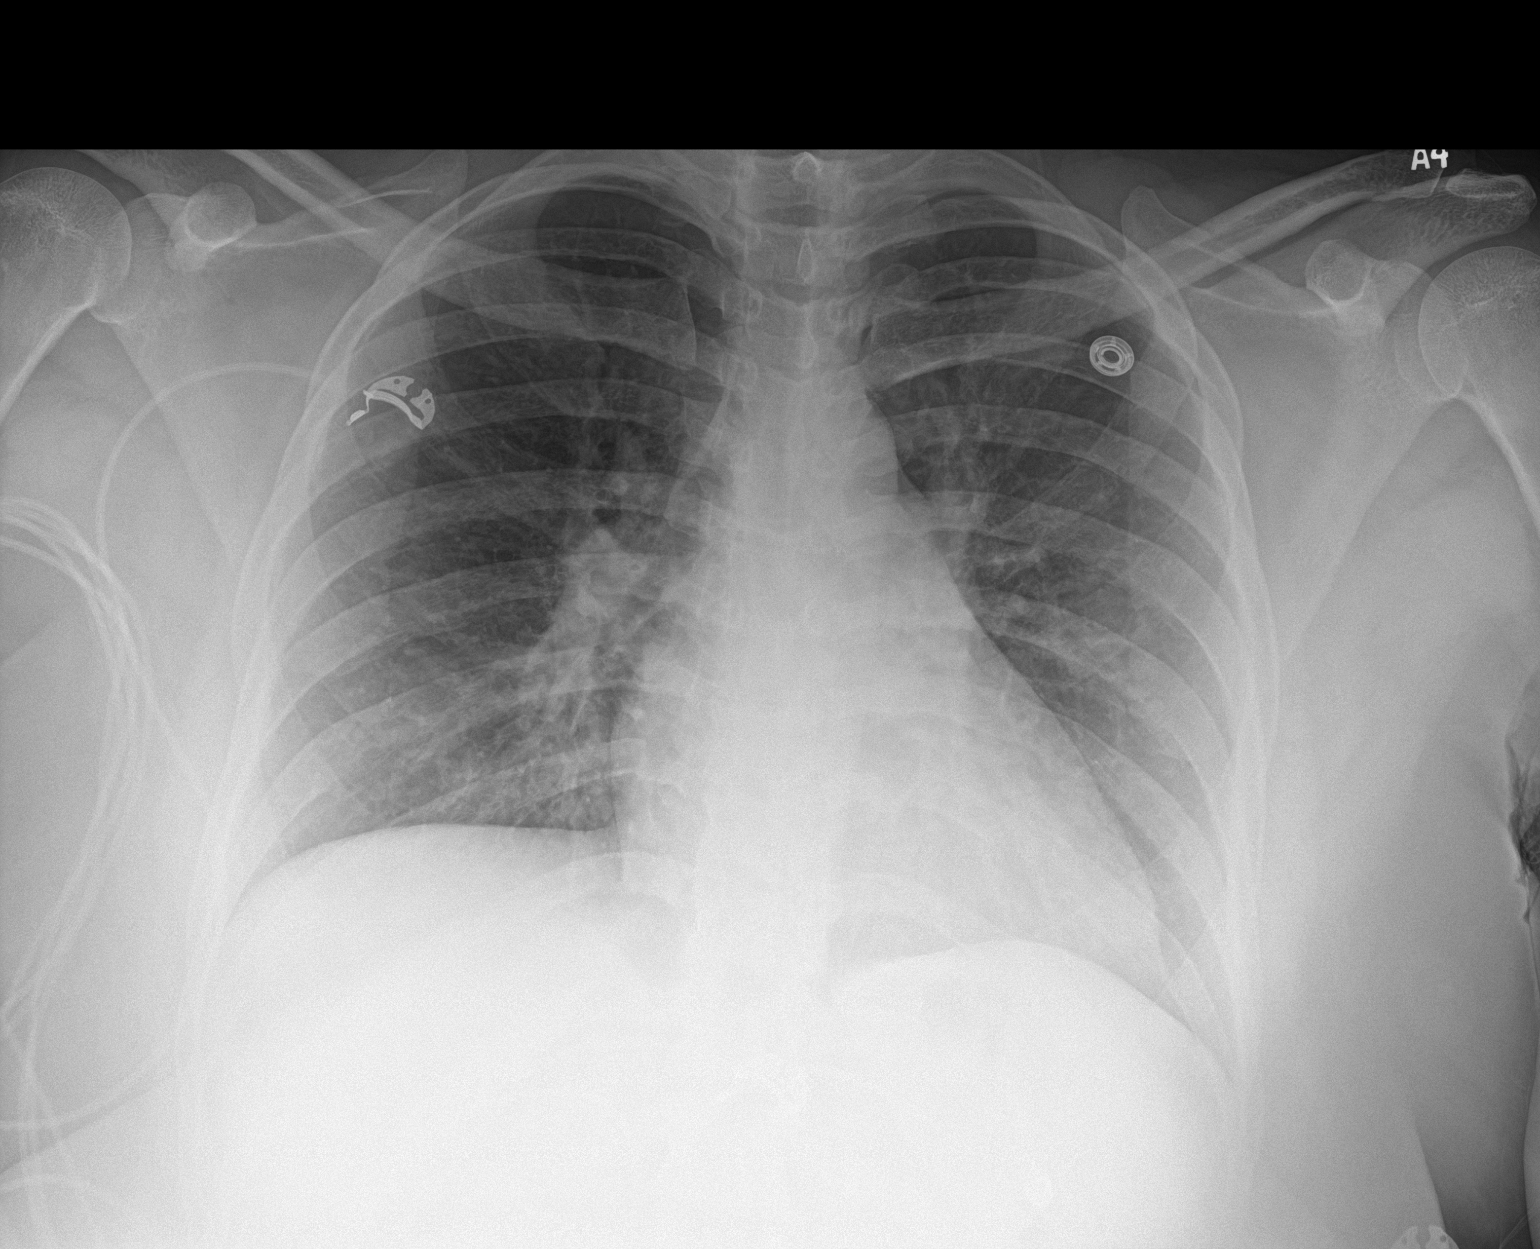

[1 of 1 positions shown; findings below may reference images not displayed]

FINDINGS: Mediastinum and hilar structures are normal. Heart size normal. Mild
infiltrate right lung base cannot be excluded. Aspiration could
present in this fashion. No pleural effusion or pneumothorax .
IMPRESSION: Mild infiltrate right lung base cannot be excluded. Aspiration could
present this fashion.

## 2019-06-03 ENCOUNTER — Telehealth: Payer: Self-pay | Admitting: *Deleted

## 2019-06-03 ENCOUNTER — Other Ambulatory Visit: Payer: Self-pay | Admitting: *Deleted

## 2019-06-03 DIAGNOSIS — Z20822 Contact with and (suspected) exposure to covid-19: Secondary | ICD-10-CM

## 2019-06-03 NOTE — Telephone Encounter (Signed)
Pt called back Sch her for GV test at 12pm on 06/04/2019

## 2019-06-03 NOTE — Telephone Encounter (Signed)
Attempted to contact pt to schedule COVID testing per Katherine McNamanama, NP; left message on voicemail. 

## 2019-06-04 ENCOUNTER — Other Ambulatory Visit: Payer: Self-pay

## 2019-06-04 DIAGNOSIS — Z20822 Contact with and (suspected) exposure to covid-19: Secondary | ICD-10-CM

## 2019-06-10 LAB — NOVEL CORONAVIRUS, NAA: SARS-CoV-2, NAA: NOT DETECTED

## 2021-06-15 ENCOUNTER — Emergency Department (HOSPITAL_BASED_OUTPATIENT_CLINIC_OR_DEPARTMENT_OTHER)
Admission: EM | Admit: 2021-06-15 | Discharge: 2021-06-15 | Disposition: A | Discharge status: Home / Self Care | Payer: Self-pay | Attending: Emergency Medicine | Admitting: Emergency Medicine

## 2021-06-15 ENCOUNTER — Other Ambulatory Visit: Payer: Self-pay

## 2021-06-15 ENCOUNTER — Encounter (HOSPITAL_BASED_OUTPATIENT_CLINIC_OR_DEPARTMENT_OTHER): Payer: Self-pay

## 2021-06-15 DIAGNOSIS — J45909 Unspecified asthma, uncomplicated: Secondary | ICD-10-CM | POA: Insufficient documentation

## 2021-06-15 DIAGNOSIS — Z79899 Other long term (current) drug therapy: Secondary | ICD-10-CM | POA: Insufficient documentation

## 2021-06-15 DIAGNOSIS — L239 Allergic contact dermatitis, unspecified cause: Secondary | ICD-10-CM | POA: Insufficient documentation

## 2021-06-15 DIAGNOSIS — F17210 Nicotine dependence, cigarettes, uncomplicated: Secondary | ICD-10-CM | POA: Insufficient documentation

## 2021-06-15 MED ORDER — METHYLPREDNISOLONE 4 MG PO TBPK: ORAL_TABLET | ORAL | 0 refills | Status: AC

## 2021-06-15 MED ORDER — HYDROXYZINE HCL 25 MG PO TABS: 25.0000 mg | ORAL_TABLET | Freq: Four times a day (QID) | ORAL | 0 refills | Status: AC | PRN

## 2021-06-15 MED ORDER — METHYLPREDNISOLONE SODIUM SUCC 125 MG IJ SOLR
125.0000 mg | Freq: Once | INTRAMUSCULAR | Status: AC
  Administered 2021-06-15: 125 mg via INTRAMUSCULAR
  Filled 2021-06-15: qty 2

## 2021-06-15 NOTE — ED Triage Notes (Signed)
Pt reports swelling to face and itching all over that started Monday and got worse since yesterday - states she is allergic to hair dye .     Denies SOB / CP / difficulty swallowing  G[facility]  15

## 2021-06-15 NOTE — ED Provider Notes (Signed)
[facility] Provider Note: Lowella Dell, MD, FACEP  CSN: [id number] MRN: [id number] ARRIVAL: 06/15/21 at 0151 ROOM: DB013/DB013   CHIEF COMPLAINT  Allergic Reaction   HISTORY OF PRESENT ILLNESS  06/15/21 2:03 AM Sara Scott is a 40 y.o. female who was wearing a wig earlier this month and developed a rash on her face "wherever the wig touched".  The rash is pruritic, somewhat crusted and confluent.  She stopped wearing the wig about a month ago, has been taking Benadryl regularly and using mometasone cream without relief of the rash.  She is not aware of any other trigger.  She has not been in contact with any suspected poisonous plants.  The Benadryl is not completely treat the itching and the medicine has not significantly improved the rash.  She is here this morning because she developed swelling around her eyes.  She does have a history of angioedema and eczema as well as dermatitis.  She states this is somewhat different than her previous episodes.  She has an appointment with her allergist but not until next month.  She has had no throat swelling, difficulty breathing, nausea, vomiting or diarrhea.  It has now spread to involve her hands as well.   Past Medical History:  Diagnosis Date   Anxiety    Asthma    Chronic lower back pain    Daily headache    Depression    Dermatitis    "comes on me anywhere" (07/18/2017)   Eczema    "comes on me anywhere" (07/18/2017)   Environmental allergies    "year round" (07/18/2017)   GERD (gastroesophageal reflux disease)    Pneumonia 07/18/2017   Pre-diabetes     Past Surgical History:  Procedure Laterality Date   CHOLECYSTECTOMY N/A 05/30/2015   Procedure: LAPAROSCOPIC CHOLECYSTECTOMY;  Surgeon: Emelia Loron, MD;  Location: [facility] OR;  Service: General;  Laterality: N/A;   TOOTH EXTRACTION Left     Family History  Problem Relation Age of Onset   Hypertension Mother    Hypertension Father     Social History   Tobacco  Use   Smoking status: Light Smoker    Years: 21.00    Types: Cigarettes   Smokeless tobacco: Never   Tobacco comments:    07/18/2017 "smoke mostly on the weekends"  Vaping Use   Vaping Use: Never used  Substance Use Topics   Alcohol use: Yes    Alcohol/week: 6.0 standard drinks    Types: 6 Glasses of wine per week    Comment: 07/18/2017 "I'll drink a bottle on a weekend"   Drug use: Yes    Types: Marijuana, Cocaine    Comment: 07/18/2017 "party once/month"    Prior to Admission medications   Medication Sig Start Date End Date Taking? Authorizing Provider  hydrOXYzine (ATARAX/VISTARIL) 25 MG tablet Take 1-2 tablets (25-50 mg total) by mouth every 6 (six) hours as needed for itching (May cause drowsiness). 06/15/21  Yes Betzaira Mentel, MD  methylPREDNISolone (MEDROL DOSEPAK) 4 MG TBPK tablet Take tapering dose per package instructions beginning 06/16/2021. 06/15/21  Yes Janett Kamath, MD  azelastine (ASTELIN) 0.1 % nasal spray Place 2 sprays into both nostrils 2 (two) times daily. Use in each nostril as directed    [provider]  busPIRone (BUSPAR) 15 MG tablet Take 15 mg by mouth 2 (two) times daily.    [provider]  cetirizine (ZYRTEC) 10 MG tablet Take 10 mg by mouth daily.    [provider]  clonazePAM (KLONOPIN) 0.5 MG tablet Take 1 tablet (0.5 mg total) by mouth 2 (two) times daily as needed for anxiety (Try to limit daily use of this medication as this is not a good long term medication for daily use). 07/19/17   Rozann Lesches, MD  MIRENA 20 MCG/24HR IUD 1 each by Intrauterine route once.  03/22/15   [provider]  mometasone (ELOCON) 0.1 % ointment Apply 1 application topically daily as needed (dermatosis).    [provider]  montelukast (SINGULAIR) 10 MG tablet Take 10 mg by mouth at bedtime.    [provider]  olopatadine (PATANOL) 0.1 % ophthalmic solution Place 1 drop into both eyes daily.    [provider]   PARoxetine (PAXIL) 40 MG tablet Take 40 mg by mouth every morning.    [provider]  propranolol (INDERAL) 20 MG tablet Take 20 mg by mouth 3 (three) times daily as needed (anxiety).    [provider]  ranitidine (ZANTAC) 150 MG tablet Take 150 mg by mouth 2 (two) times daily as needed for heartburn.     [provider]    Allergies Shellfish allergy, Nickel, and Silver   REVIEW OF SYSTEMS  Negative except as noted here or in the History of Present Illness.   PHYSICAL EXAMINATION  Initial Vital Signs Blood pressure 125/84, pulse 81, temperature 98.1 F (36.7 C), temperature source Oral, resp. rate 18, height 6' (1.829 m), weight 108.9 kg, last menstrual period 06/14/2021, SpO2 98 %.  Examination General: Well-developed, well-nourished female in no acute distress; appearance consistent with age of record HENT: normocephalic; atraumatic; confluent somewhat crusted rash of the face with periorbital edema:    Eyes: Normal appearance apart from periorbital edema Neck: supple Heart: regular rate and rhythm Lungs: clear to auscultation bilaterally Abdomen: soft; nondistended; nontender; bowel sounds present Extremities: No deformity; full range of motion Neurologic: Awake, alert and oriented; motor function intact in all extremities and symmetric; no facial droop Skin: Warm and dry; similar rash of dorsal hands bilaterally:    Psychiatric: Normal mood and affect   RESULTS  Summary of this visit's results, reviewed and interpreted by myself:   EKG Interpretation  Date/Time:    Ventricular Rate:    PR Interval:    QRS Duration:   QT Interval:    QTC Calculation:   R Axis:     Text Interpretation:         Laboratory Studies: No results found for this or any previous visit (from the past 24 hour(s)). Imaging Studies: No results found.  ED COURSE and MDM  Nursing notes, initial and subsequent vitals signs, including pulse oximetry,  reviewed and interpreted by myself.  Vitals:   06/15/21 0158 06/15/21 0159  BP: 125/84   Pulse: 81   Resp: 18   Temp: 98.1 F (36.7 C)   TempSrc: Oral   SpO2: 98%   Weight:  108.9 kg  Height:  6' (1.829 m)   Medications  methylPREDNISolone sodium succinate (SOLU-MEDROL) 125 mg/2 mL injection 125 mg (has no administration in time range)    The cause of the rash is unclear.  That it followed the wearing of the week is suspicious but she continues to have symptoms despite not wearing it for a week.  Lack of relief with Benadryl suggest there is more cell-mediated involvement than just histamine.  The mometasone is a low to medium potency steroid but does not appear to be adequate and would not be expected  to relieve the periorbital edema.  We will give her a shot of Solu-Medrol in the ED and as well as a Medrol dose pack.  We will also prescribe hydroxyzine for better treatment of the itching with the caveat of increased drowsiness over Benadryl.  PROCEDURES  Procedures   ED DIAGNOSES     ICD-10-CM   1. Allergic contact dermatitis, unspecified trigger  L23.9          Jonnell Hentges, Jonny Ruiz, MD 06/15/21 [phone]

## 2023-01-22 ENCOUNTER — Encounter (HOSPITAL_BASED_OUTPATIENT_CLINIC_OR_DEPARTMENT_OTHER): Payer: Self-pay

## 2023-01-22 ENCOUNTER — Other Ambulatory Visit: Payer: Self-pay

## 2023-01-22 ENCOUNTER — Emergency Department (HOSPITAL_BASED_OUTPATIENT_CLINIC_OR_DEPARTMENT_OTHER)
Admission: EM | Admit: 2023-01-22 | Discharge: 2023-01-22 | Disposition: A | Discharge status: Home / Self Care | Payer: BC Managed Care – PPO | Attending: Emergency Medicine | Admitting: Emergency Medicine

## 2023-01-22 DIAGNOSIS — J069 Acute upper respiratory infection, unspecified: Secondary | ICD-10-CM | POA: Insufficient documentation

## 2023-01-22 DIAGNOSIS — R059 Cough, unspecified: Secondary | ICD-10-CM | POA: Diagnosis present

## 2023-01-22 DIAGNOSIS — Z20822 Contact with and (suspected) exposure to covid-19: Secondary | ICD-10-CM | POA: Diagnosis not present

## 2023-01-22 LAB — RESP PANEL BY RT-PCR (RSV, FLU A&B, COVID)  RVPGX2
Influenza A by PCR: NEGATIVE
Influenza B by PCR: NEGATIVE
Resp Syncytial Virus by PCR: NEGATIVE
SARS Coronavirus 2 by RT PCR: NEGATIVE

## 2023-01-22 NOTE — ED Provider Notes (Signed)
[facility] Provider Note   CSN: [id number] Arrival date & time: 01/22/23  1830     History  Chief Complaint  Patient presents with   Cough    Sara Scott is a 42 y.o. female. With a history of anxiety, depression, environmental allergies, GERD who presents to the ED for evaluation of URI symptoms including cough productive of green sputum, subjective fevers and chills, sore throat, congestion, rhinorrhea.  She states her symptoms began on Friday.  Denies known sick contacts.  States symptoms have gotten worse.  She has tried TheraFlu at home with no improvement in her symptoms.  She has not checked her temperature.  She denies chest pain, shortness of breath, rigors, vomiting, abdominal pain.   Cough Associated symptoms: rhinorrhea        Home Medications Prior to Admission medications   Medication Sig Start Date End Date Taking? Authorizing Provider  azelastine (ASTELIN) 0.1 % nasal spray Place 2 sprays into both nostrils 2 (two) times daily. Use in each nostril as directed    [provider]  busPIRone (BUSPAR) 15 MG tablet Take 15 mg by mouth 2 (two) times daily.    [provider]  cetirizine (ZYRTEC) 10 MG tablet Take 10 mg by mouth daily.    [provider]  clonazePAM (KLONOPIN) 0.5 MG tablet Take 1 tablet (0.5 mg total) by mouth 2 (two) times daily as needed for anxiety (Try to limit daily use of this medication as this is not a good long term medication for daily use). 07/19/17   Thomasene Ripple, MD  hydrOXYzine (ATARAX/VISTARIL) 25 MG tablet Take 1-2 tablets (25-50 mg total) by mouth every 6 (six) hours as needed for itching (May cause drowsiness). 06/15/21   Molpus, John, MD  methylPREDNISolone (MEDROL DOSEPAK) 4 MG TBPK tablet Take tapering dose per package instructions beginning 06/16/2021. 06/15/21   Molpus, Jenny Reichmann, MD  MIRENA 20 MCG/24HR IUD 1 each by Intrauterine route once.  03/22/15   [provider]  mometasone (ELOCON) 0.1 % ointment Apply 1 application topically daily as needed (dermatosis).    [provider]  montelukast (SINGULAIR) 10 MG tablet Take 10 mg by mouth at bedtime.    [provider]  olopatadine (PATANOL) 0.1 % ophthalmic solution Place 1 drop into both eyes daily.    [provider]  PARoxetine (PAXIL) 40 MG tablet Take 40 mg by mouth every morning.    [provider]  propranolol (INDERAL) 20 MG tablet Take 20 mg by mouth 3 (three) times daily as needed (anxiety).    [provider]  ranitidine (ZANTAC) 150 MG tablet Take 150 mg by mouth 2 (two) times daily as needed for heartburn.     [provider]      Allergies    Shellfish allergy, Nickel, and Silver    Review of Systems   Review of Systems  HENT:  Positive for congestion and rhinorrhea.   Respiratory:  Positive for cough.   All other systems reviewed and are negative.   Physical Exam Updated Vital Signs BP 118/82   Pulse 89   Temp 98.4 F (36.9 C)   Resp 17   Ht 6' (1.829 m)   Wt 108.9 kg   SpO2 99%   BMI 32.55 kg/m  Physical Exam Vitals and nursing note reviewed.  Constitutional:      General: She is not in acute distress.    Appearance: She is well-developed. She is obese.  She is not ill-appearing, toxic-appearing or diaphoretic.     Comments: Resting comfortably in chair  HENT:     Head: Normocephalic and atraumatic.     Nose: Congestion and rhinorrhea present.     Mouth/Throat:     Mouth: Mucous membranes are moist.     Pharynx: Oropharynx is clear. No oropharyngeal exudate or posterior oropharyngeal erythema.  Eyes:     Conjunctiva/sclera: Conjunctivae normal.  Cardiovascular:     Rate and Rhythm: Normal rate and regular rhythm.     Heart sounds: No murmur heard. Pulmonary:     Effort: Pulmonary effort is normal. No respiratory distress.     Breath sounds: Normal breath sounds. No stridor. No wheezing, rhonchi or  rales.  Abdominal:     Palpations: Abdomen is soft.     Tenderness: There is no abdominal tenderness.  Musculoskeletal:        General: No swelling.     Cervical back: Neck supple.  Lymphadenopathy:     Cervical: No cervical adenopathy.  Skin:    General: Skin is warm and dry.     Capillary Refill: Capillary refill takes less than 2 seconds.  Neurological:     General: No focal deficit present.     Mental Status: She is alert and oriented to person, place, and time.  Psychiatric:        Mood and Affect: Mood normal.     ED Results / Procedures / Treatments   Labs (all labs ordered are listed, but only abnormal results are displayed) Labs Reviewed  RESP PANEL BY RT-PCR (RSV, FLU A&B, COVID)  [id number]    EKG None  Radiology No results found.  Procedures Procedures    Medications Ordered in ED Medications - No data to display  ED Course/ Medical Decision Making/ A&P                             Medical Decision Making This patient presents to the ED for concern of cough, this involves an extensive number of treatment options, and is a complaint that carries with it a high risk of complications and morbidity.  Differential diagnosis for emergent cause of cough includes but is not limited to upper respiratory infection, lower respiratory infection, allergies, asthma, irritants, foreign body, medications such as ACE inhibitors, reflux, asthma, CHF, lung cancer, interstitial lung disease, psychiatric causes, postnasal drip and postinfectious bronchospasm.  Co morbidities that complicate the patient evaluation  anxiety, depression, environmental allergies, GERD  My initial workup includes respiratory panel  Additional history obtained from: Nursing notes from this visit.  I ordered, reviewed and interpreted labs which include: respiratory panel  Afebrile, hemodynamically stable.  42 year old female presenting to the ED for evaluation of cough as well as other URI type  symptoms.  On exam she appears overall very well.  She does have an occasional cough but does not have adventitious breath sounds.  She has no cervical lymphadenopathy.  Her posterior oropharynx reveals postnasal drip but is otherwise unremarkable.  Her respiratory panel was negative.  She decision-making conversation was had with patient regarding chest x-ray to evaluate for possible pneumonia due to green sputum.  She declined at this time.  She was encouraged to follow-up with her primary care provider within the next week if her symptoms do not improve for reassessment.  Findings consistent with viral URI.  She was given information regarding supportive care of her symptoms.  She was encouraged  to monitor her temperature and treat with Tylenol and ibuprofen if she has a fever.  She was given return precautions.  Stable at discharge.  At this time there does not appear to be any evidence of an acute emergency medical condition and the patient appears stable for discharge with appropriate outpatient follow up. Diagnosis was discussed with patient who verbalizes understanding of care plan and is agreeable to discharge. I have discussed return precautions with patient who verbalizes understanding. Patient encouraged to follow-up with their PCP within 1 week. All questions answered.  Note: Portions of this report may have been transcribed using voice recognition software. Every effort was made to ensure accuracy; however, inadvertent computerized transcription errors may still be present.        Final Clinical Impression(s) / ED Diagnoses Final diagnoses:  Viral URI with cough    Rx / DC Orders ED Discharge Orders     None         Roylene Reason, PA-C 01/22/23 2250    Audley Hose, MD 01/23/23 [phone]

## 2023-01-22 NOTE — ED Triage Notes (Signed)
Patient arrives to ED POV c/o cough, chills, body aches since Friday. No other complaints at this time. Pt A/O x4.

## 2023-01-22 NOTE — Discharge Instructions (Signed)
You have been seen today for your complaint of cough, upper respiratory infection. Your lab work was negative for flu, COVID and RSV. Your discharge medications include Claritin-D and Flonase.  These over-the-counter medications used to treat your symptoms.  Follow dosing instructions on the packaging. Alternate tylenol and ibuprofen for pain and fever. You may alternate these every 4 hours. You may take up to 800 mg of ibuprofen at a time and up to 1000 mg of tylenol. Home care instructions are as follows:  Drink plenty of water Follow up with: Your primary care provider in 1 week for reevaluation Please seek immediate medical care if you develop any of the following symptoms: You are getting worse instead of better. You have a fever or chills. Your mucus is brown or red. You have yellow or brown discharge coming from your nose. You have pain in your face, especially when you bend forward. You have swollen neck glands. You have pain while swallowing. You have white areas in the back of your throat. At this time there does not appear to be the presence of an emergent medical condition, however there is always the potential for conditions to change. Please read and follow the below instructions.  Do not take your medicine if  develop an itchy rash, swelling in your mouth or lips, or difficulty breathing; call 911 and seek immediate emergency medical attention if this occurs.  You may review your lab tests and imaging results in their entirety on your MyChart account.  Please discuss all results of fully with your primary care provider and other specialist at your follow-up visit.  Note: Portions of this text may have been transcribed using voice recognition software. Every effort was made to ensure accuracy; however, inadvertent computerized transcription errors may still be present.

## 2023-01-22 NOTE — ED Notes (Signed)
Patient verbalizes understanding of discharge instructions. Opportunity for questioning and answers were provided. Armband removed by staff, pt discharged from ED. Ambulated out to lobby
